# Patient Record
Sex: Female | Born: 1945 | Race: White | Hispanic: No | Smoking: Never smoker
Health system: Southern US, Community
[De-identification: ages and names within clinical notes are randomized; demographics above are authoritative.]

## PROBLEM LIST (undated history)

## (undated) DIAGNOSIS — R002 Palpitations: Secondary | ICD-10-CM

## (undated) HISTORY — DX: Palpitations: R00.2

## (undated) HISTORY — PX: WISDOM TOOTH EXTRACTION: SHX21

---

## 1998-05-21 ENCOUNTER — Other Ambulatory Visit: Admission: RE | Admit: 1998-05-21 | Discharge: 1998-05-21 | Payer: Self-pay | Admitting: *Deleted

## 1999-05-27 ENCOUNTER — Other Ambulatory Visit: Admission: RE | Admit: 1999-05-27 | Discharge: 1999-05-27 | Payer: Self-pay | Admitting: *Deleted

## 1999-05-28 ENCOUNTER — Other Ambulatory Visit: Admission: RE | Admit: 1999-05-28 | Discharge: 1999-05-28 | Payer: Self-pay | Admitting: *Deleted

## 1999-05-28 ENCOUNTER — Encounter (INDEPENDENT_AMBULATORY_CARE_PROVIDER_SITE_OTHER): Payer: Self-pay

## 2000-05-30 ENCOUNTER — Other Ambulatory Visit: Admission: RE | Admit: 2000-05-30 | Discharge: 2000-05-30 | Payer: Self-pay | Admitting: *Deleted

## 2000-06-02 ENCOUNTER — Encounter: Payer: Self-pay | Admitting: *Deleted

## 2000-06-02 ENCOUNTER — Encounter: Admission: RE | Admit: 2000-06-02 | Discharge: 2000-06-02 | Payer: Self-pay | Admitting: *Deleted

## 2000-06-30 ENCOUNTER — Encounter (INDEPENDENT_AMBULATORY_CARE_PROVIDER_SITE_OTHER): Payer: Self-pay

## 2000-06-30 ENCOUNTER — Other Ambulatory Visit: Admission: RE | Admit: 2000-06-30 | Discharge: 2000-06-30 | Payer: Self-pay | Admitting: *Deleted

## 2000-07-14 ENCOUNTER — Encounter (INDEPENDENT_AMBULATORY_CARE_PROVIDER_SITE_OTHER): Payer: Self-pay

## 2000-07-14 ENCOUNTER — Other Ambulatory Visit: Admission: RE | Admit: 2000-07-14 | Discharge: 2000-07-14 | Payer: Self-pay | Admitting: *Deleted

## 2001-06-01 ENCOUNTER — Other Ambulatory Visit: Admission: RE | Admit: 2001-06-01 | Discharge: 2001-06-01 | Payer: Self-pay | Admitting: *Deleted

## 2001-06-05 ENCOUNTER — Encounter: Admission: RE | Admit: 2001-06-05 | Discharge: 2001-06-05 | Payer: Self-pay | Admitting: *Deleted

## 2001-06-05 ENCOUNTER — Encounter: Payer: Self-pay | Admitting: *Deleted

## 2002-06-04 ENCOUNTER — Other Ambulatory Visit: Admission: RE | Admit: 2002-06-04 | Discharge: 2002-06-04 | Payer: Self-pay | Admitting: Obstetrics & Gynecology

## 2002-12-27 ENCOUNTER — Ambulatory Visit (HOSPITAL_COMMUNITY): Admission: RE | Admit: 2002-12-27 | Discharge: 2002-12-27 | Payer: Self-pay | Admitting: Internal Medicine

## 2002-12-27 ENCOUNTER — Encounter: Payer: Self-pay | Admitting: Internal Medicine

## 2003-03-09 HISTORY — PX: IRRIGATION AND DEBRIDEMENT SEBACEOUS CYST: SHX5255

## 2003-06-17 ENCOUNTER — Other Ambulatory Visit: Admission: RE | Admit: 2003-06-17 | Discharge: 2003-06-17 | Payer: Self-pay | Admitting: Obstetrics & Gynecology

## 2003-12-18 ENCOUNTER — Ambulatory Visit (HOSPITAL_BASED_OUTPATIENT_CLINIC_OR_DEPARTMENT_OTHER): Admission: RE | Admit: 2003-12-18 | Discharge: 2003-12-18 | Payer: Self-pay | Admitting: Plastic Surgery

## 2003-12-18 ENCOUNTER — Ambulatory Visit (HOSPITAL_COMMUNITY): Admission: RE | Admit: 2003-12-18 | Discharge: 2003-12-18 | Payer: Self-pay | Admitting: Plastic Surgery

## 2003-12-18 ENCOUNTER — Encounter (INDEPENDENT_AMBULATORY_CARE_PROVIDER_SITE_OTHER): Payer: Self-pay | Admitting: Specialist

## 2004-03-08 HISTORY — PX: ORIF DISTAL RADIUS FRACTURE: SUR927

## 2004-03-08 HISTORY — PX: COLONOSCOPY: SHX174

## 2004-08-10 ENCOUNTER — Ambulatory Visit (HOSPITAL_COMMUNITY): Admission: RE | Admit: 2004-08-10 | Discharge: 2004-08-10 | Payer: Self-pay | Admitting: Internal Medicine

## 2004-08-10 ENCOUNTER — Ambulatory Visit: Payer: Self-pay | Admitting: Internal Medicine

## 2004-09-24 ENCOUNTER — Ambulatory Visit (HOSPITAL_COMMUNITY): Admission: RE | Admit: 2004-09-24 | Discharge: 2004-09-24 | Payer: Self-pay | Admitting: Internal Medicine

## 2004-09-29 ENCOUNTER — Encounter (HOSPITAL_COMMUNITY): Admission: RE | Admit: 2004-09-29 | Discharge: 2004-10-29 | Payer: Self-pay | Admitting: Internal Medicine

## 2004-10-28 ENCOUNTER — Ambulatory Visit (HOSPITAL_COMMUNITY): Admission: RE | Admit: 2004-10-28 | Discharge: 2004-10-28 | Payer: Self-pay | Admitting: *Deleted

## 2004-10-28 ENCOUNTER — Ambulatory Visit (HOSPITAL_BASED_OUTPATIENT_CLINIC_OR_DEPARTMENT_OTHER): Admission: RE | Admit: 2004-10-28 | Discharge: 2004-10-29 | Payer: Self-pay | Admitting: *Deleted

## 2005-02-11 ENCOUNTER — Ambulatory Visit (HOSPITAL_COMMUNITY): Admission: RE | Admit: 2005-02-11 | Discharge: 2005-02-11 | Payer: Self-pay | Admitting: Internal Medicine

## 2005-12-10 ENCOUNTER — Ambulatory Visit (HOSPITAL_COMMUNITY): Admission: RE | Admit: 2005-12-10 | Discharge: 2005-12-10 | Payer: Self-pay | Admitting: Internal Medicine

## 2007-07-26 ENCOUNTER — Ambulatory Visit (HOSPITAL_COMMUNITY): Admission: RE | Admit: 2007-07-26 | Discharge: 2007-07-26 | Payer: Self-pay | Admitting: Internal Medicine

## 2007-08-18 ENCOUNTER — Encounter: Admission: RE | Admit: 2007-08-18 | Discharge: 2007-08-18 | Payer: Self-pay | Admitting: Obstetrics & Gynecology

## 2008-02-07 ENCOUNTER — Ambulatory Visit (HOSPITAL_COMMUNITY): Admission: RE | Admit: 2008-02-07 | Discharge: 2008-02-07 | Payer: Self-pay | Admitting: Internal Medicine

## 2009-06-13 ENCOUNTER — Encounter: Admission: RE | Admit: 2009-06-13 | Discharge: 2009-06-13 | Payer: Self-pay | Admitting: Allergy and Immunology

## 2010-02-17 ENCOUNTER — Encounter (INDEPENDENT_AMBULATORY_CARE_PROVIDER_SITE_OTHER): Payer: Self-pay | Admitting: Internal Medicine

## 2010-02-17 ENCOUNTER — Ambulatory Visit (HOSPITAL_COMMUNITY)
Admission: RE | Admit: 2010-02-17 | Discharge: 2010-02-17 | Payer: Self-pay | Source: Home / Self Care | Attending: Internal Medicine | Admitting: Internal Medicine

## 2010-03-13 ENCOUNTER — Encounter: Payer: Self-pay | Admitting: Cardiology

## 2010-03-13 ENCOUNTER — Ambulatory Visit
Admission: RE | Admit: 2010-03-13 | Discharge: 2010-03-13 | Payer: Self-pay | Source: Home / Self Care | Attending: Cardiology | Admitting: Cardiology

## 2010-03-13 DIAGNOSIS — R002 Palpitations: Secondary | ICD-10-CM | POA: Insufficient documentation

## 2010-04-08 ENCOUNTER — Other Ambulatory Visit: Payer: Self-pay | Admitting: Obstetrics and Gynecology

## 2010-04-09 NOTE — Assessment & Plan Note (Signed)
Summary: ***NP6 PALPITATIONS.   Visit Type:  Initial Consult Primary Provider:  Osborne Casco   History of Present Illness: [name] is seen at the kind request of Dr. Chauncey Cruel for evaluation of palpitations.  This nice woman has enjoyed generally excellent health.  She has had no cardiovascular issues nor has she undergone any significant testing other than a recent echocardiogram.  For some months, she has noted intermittent palpitations without associated symptoms.  These had been increasing in frequency and duration up to a recent visit to Dr. Ouida Sills, but have subsequently improved.  Nonetheless, she is symptomatic perhaps once per week.  The duration of spells is between 3 and 30 seconds.  EKG  Procedure date:  03/13/2010  Findings:      Normal sinus rhythm Left axis deviation Right ventricular conduction delay Otherwise within normal limits No previous tracing for comparison   Current Medications (verified): 1)  Zyrtec Allergy 10 Mg Tabs (Cetirizine Hcl) .... Take 1 Tab At Bedtime 2)  Astepro 0.15 % Soln (Azelastine Hcl) .... Use 1 Spray Each Nostril Daily 3)  Nasonex 50 Mcg/act Susp (Mometasone Furoate) .Marland Kitchen.. 1 Spray Each Nostril Daily 4)  Oscal 500/200 D-3 500-200 Mg-Unit Tabs (Calcium Carbonate-Vitamin D) .... Take 1 Tab Daily 5)  Fish Oil 1000 Mg Caps (Omega-3 Fatty Acids) .... 2 Caps Daily 6)  Vitamin D 1000 Unit Tabs (Cholecalciferol) .... Take 1 Tab Daily 7)  Vitamin C 1000 Mg Tabs (Ascorbic Acid) .... Take 1 Tab Daily  Past History:  PMH, FH, and Social History reviewed and updated.  Past Medical History: Palpitations  Past Surgical History: ORIF-right radius in 2006 Excision of sebaceous cyst-2005 Colonoscopy-negative in 2006 Wisdom teeth extracted  Family History: Father deceased at age 49 as a result of Alzheimer's Mother is alive and well Siblings: 2 sisters alive and well.  Social History: Employment-does not work outside of the home Married  with 2 children No use of tobacco products or alcohol  Review of Systems       patient experiences occasional headaches attributed to sinus congestion.  She requires corrective lenses.  All other systems reviewed and are negative.  Vital Signs:  Patient profile:   65 year old female Height:      69 inches Weight:      174 pounds BMI:     25.79 O2 Sat:      98 % on Room air Pulse rate:   78 / minute BP sitting:   114 / 64  (left arm)  Vitals Entered By: Dreama Saa, CNA (March 13, 2010 11:21 AM)  O2 Flow:  Room air  Physical Exam  General:  Proportionate weight and height; well-developed; no acute distress: HEENT-[place]/AT; PERRL; EOM intact; conjunctiva and lids nl:  Neck-No JVD; no carotid bruits: Endocrine-mild diffuse thyromegaly: Lungs-No tachypnea, clear without rales, rhonchi or wheezes: CV-normal PMI; normal S1 and S2: Abdomen-BS normal; soft and non-tender without masses or organomegaly: MS-No deformities, cyanosis or clubbing: Neurologic-Nl cranial nerves; symmetric strength and tone: Skin- Warm, no sig. lesions: Extremities-Nl distal pulses; no edema    Impression & Recommendations:  Problem # 1:  PALPITATIONS (ICD-785.1) Patient is experiencing symptoms suggestive of a supraventricular tachycardia.  Her echocardiogram was reviewed and demonstrates no significant abnormalities.  Her EKG shows no conditions that would predispose her to serious arrhythmias.  She was somewhat disinclined to undergo any testing.  I explained that an event recorder is a fairly minor intervention and will allow [place] to have a  specific diagnosis, which may be helpful the future if her arrhythmia worsens.  We will proceed with this study, and I will see her again in one month.  A recent TSH and basic laboratory studies were negative.  No other testing is indicated at the present time.  Other Orders: Cardionet/Event Monitor (Cardionet/Event)  Patient Instructions: 1)  Your physician  recommends that you schedule a follow-up appointment in: 5 weeks 2)  Your physician has recommended that you wear an event monitor.  Event monitors are medical devices that record the heart's electrical activity. Doctors most often use these monitors to diagnose arrhythmias. Arrhythmias are problems with the speed or rhythm of the heartbeat. The monitor is a small, portable device. You can wear one while you do your normal daily activities. This is usually used to diagnose what is causing palpitations/syncope (passing out).

## 2010-04-15 ENCOUNTER — Encounter: Payer: Self-pay | Admitting: Cardiology

## 2010-04-15 ENCOUNTER — Ambulatory Visit (INDEPENDENT_AMBULATORY_CARE_PROVIDER_SITE_OTHER): Payer: BC Managed Care – PPO | Admitting: Cardiology

## 2010-04-15 DIAGNOSIS — I471 Supraventricular tachycardia: Secondary | ICD-10-CM

## 2010-04-29 NOTE — Assessment & Plan Note (Signed)
Summary: 5 wk.f/u per checkout on 03/13/10/tg/lv   Visit Type:  5 week follow up Primary Provider:  Osborne Casco   History of Present Illness: [name] has continued to experience occasional episodes of palpitations lasting a matter of seconds.  She was not absolutely certain that these 4 identical to her previous symptoms, but event recording shows that she reliably identified runs of nonsustained supraventricular tachycardia.  Episodes are somewhat different than her initial spells in that they are of shorter duration, less intense and less frequent.  The longest episode she recalls was 30 seconds.  She does not feel that she needs pharmacologic therapy to suppress his arrhythmias.  Preventive Screening-Counseling & Management  Alcohol-Tobacco     Smoking Status: never  Current Medications (verified): 1)  Zyrtec Allergy 10 Mg Tabs (Cetirizine Hcl) .... Take 1 Tab At Bedtime 2)  Astepro 0.15 % Soln (Azelastine Hcl) .... Use 1 Spray Each Nostril Daily 3)  Nasonex 50 Mcg/act Susp (Mometasone Furoate) .Marland Kitchen.. 1 Spray Each Nostril Daily 4)  Calcium Citrate + 315-200 Mg-Unit Tabs (Calcium Citrate-Vitamin D) .... Take 2 Tablets By Mouth Two Times A Day 5)  Fish Oil 1000 Mg Caps (Omega-3 Fatty Acids) .... 2 Caps Daily 6)  Vitamin D 1000 Unit Tabs (Cholecalciferol) .... Take 1 Tab Daily 7)  Vitamin C 1000 Mg Tabs (Ascorbic Acid) .... Take 1 Tab Daily 8)  Vitamin D 2000 Unit Tabs (Cholecalciferol) .... Take 1 Tablet By Mouth Once A Day  Allergies: 1)  ! Penicillin  Past History:  PMH, FH, and Social History reviewed and updated.  Past Medical History: Palpitations-short runs of SVT by event recording associated with patient's symptoms-2012  Social History: Smoking Status:  never  Vital Signs:  Patient profile:   65 year old female Height:      69 inches Weight:      171 pounds O2 Sat:      97 % on Room air Pulse rate:   78 / minute BP sitting:   107 / 72  (left  arm)  Vitals Entered By: Teressa Lower RN (April 15, 2010 11:31 AM)  O2 Flow:  Room air  Physical Exam  General:  Proportionate weight and height; well-developed; no acute distress: Neck-No JVD; no carotid bruits: Lungs-No tachypnea, clear without rales, rhonchi or wheezes: CV-normal PMI; normal S1 and S2: Abdomen-BS normal; soft and non-tender without masses or organomegaly: MS-No deformities, cyanosis or clubbing: Neurologic-Nl cranial nerves; symmetric strength and tone: Skin- Warm, no sig. lesions: Extremities-Nl distal pulses; no edema    Impression & Recommendations:  Problem # 1:  PALPITATIONS (ICD-785.1) Event recording documents the presence of short runs of supraventricular tachycardia, none lasting more than a few seconds, correlating with the patient's symptoms.  We discussed the possible benefit of treatment with a beta blocker; however, I reassured her that neither her condition nor the absence of treatment for it would adversely effect her overall health.  She prefers not to take medication on a daily basis.  She will call if she experiences more severe or prolonged spells.  I explained that there is considerable variability to these arrhythmias, but a sustained tachycardia may occur at some point in the future.  Patient Instructions: 1)  Your physician recommends that you schedule a follow-up appointment in: AS NEEDED 2)  Your physician recommends that you continue on your current medications as directed. Please refer to the Current Medication list given to you today.

## 2010-07-24 NOTE — Op Note (Signed)
Veronica Snyder, Veronica Snyder             ACCOUNT NO.:  [id number]   MEDICAL RECORD NO.:  [id number]          PATIENT TYPE:  AMB   LOCATION:  DAY                           FACILITY:  [facility]   PHYSICIAN:  Lionel December, M.D.    DATE OF BIRTH:  Nov 09, 1945   DATE OF PROCEDURE:  08/10/2004  DATE OF DISCHARGE:                                 OPERATIVE REPORT   PROCEDURE:  Colonoscopy.   INDICATION:  Caree is a 65 year old Caucasian female who is here for  screening colonoscopy.  Family history is negative for colorectal carcinoma.  The procedure risks were reviewed with the patient and informed consent was  obtained.   PREMEDICATION:  Demerol 25 mg IV, Versed 6 mg IV in divided dose.   FINDINGS:  Procedure performed in endoscopy suite.  The patient's vital  signs and O2 saturation were monitored during procedure and remained stable.  The patient was placed in the left lateral recumbent position and rectal  examination performed.  No abnormality noted on external or digital exam.  The Olympus video scope was placed in the rectum and advanced under vision  into sigmoid colon, which was tortuous.  The scope was slowly and carefully advanced into descending colon and across  the splenic flexure into transverse colon.  Further intubation of the cecum  was easy.  Preparation was felt to be satisfactory to excellent.  The scope  was advanced to cecum, which was identified by appendiceal orifice and  ileocecal valve.  Pictures taken for the record.  As the scope was  withdrawn, the colonic mucosa was carefully examined and was normal  throughout.  Rectal mucosa similarly was normal.  The scope was retroflexed  to examine the anorectal junction, and small hemorrhoids were noted below  the dentate line.  The endoscope was straightened and withdrawn.  The patient tolerated the  procedure well.   FINAL DIAGNOSIS:  Small external hemorrhoids, otherwise normal colonoscopy.   RECOMMENDATIONS:  She  should continue yearly Hemoccults and consider next  screening in 10 years from now.       NR/MEDQ  D:  08/10/2004  T:  08/10/2004  Job:  [id number]   cc:   Kingsley Callander. Ouida Sills, MD  [address]  [place]  [place] [address]  Fax: [phone]

## 2010-07-24 NOTE — Op Note (Signed)
NAMEMORNA, FLUD             ACCOUNT NO.:  [id number]   MEDICAL RECORD NO.:  [id number]          PATIENT TYPE:  AMB   LOCATION:  DSC                          FACILITY:  [facility]   PHYSICIAN:  Alfredia Ferguson, M.D.  DATE OF BIRTH:  1946/01/22   DATE OF PROCEDURE:  12/18/2003  DATE OF DISCHARGE:                                 OPERATIVE REPORT   REFERRING PHYSICIAN:  Kingsley Callander. Ouida Sills, M.D.   PREOPERATIVE DIAGNOSIS:  Sebaceous cyst right lateral neck 9 mm.   POSTOPERATIVE DIAGNOSIS:  Sebaceous cyst right lateral neck 9 mm.   OPERATION:  Excision of sebaceous cyst right lateral neck.   SURGEON:  Alfredia Ferguson, M.D.   ANESTHESIA:  Xylocaine 2% with 1:100,000 epinephrine.   INDICATIONS FOR PROCEDURE:  This is a 65 year old woman who has a cyst which  is in a relatively quiescent stage at present.  It has been infected on a  couple of occasions which required the patient to be on antibiotics.  The  patient wishes to have the residual cyst removed while it is not infected.  She understands she was trading what she has for permanent and potentially  unsightly scar.  In spite of that, she wishes to proceed with surgery.   DESCRIPTION OF PROCEDURE:  Skin marks were placed around the diseased skin  punctum.  Local anesthesia using 2% Xylocaine with 1:100,000 epinephrine was  infiltrated.  The right neck was prepped with Betadine and draped with  sterile drapes.  Elliptical skin incision was made until reaching the  subcutaneous tissue.  Using scissor dissection, the cyst capsule was  visualized and was dissected away from its surrounding adherent tissue.  The  cyst was removed in toto.  The cyst was passed off for pathology.  Hemostasis was accomplished using electrocautery.  Subcutaneous tissue was  approximated with an interrupted 5-0 Monocryl suture.  The dermis was closed  with multiple interrupted 5-0 Monocryl sutures.  Skin edges were closed with  Steri-Strips.  The area was  cleansed, dried and Steri-Strips were applied.  The patient was discharged to home in satisfactory condition.      WBB/MEDQ  D:  12/18/2003  T:  12/18/2003  Job:  [id number]   cc:   Kingsley Callander. Ouida Sills, M.D.  [address]  [place]  [place] [address]  Fax: [phone]

## 2010-07-24 NOTE — Op Note (Signed)
NAMEANGELLICA, Veronica Snyder             ACCOUNT NO.:  [id number]   MEDICAL RECORD NO.:  [id number]          PATIENT TYPE:  AMB   LOCATION:  DSC                          FACILITY:  [facility]   PHYSICIAN:  Lowell Bouton, M.D.DATE OF BIRTH:  [birth year]   DATE OF PROCEDURE:  10/28/2004  DATE OF DISCHARGE:                                 OPERATIVE REPORT   PREOPERATIVE DIAGNOSIS:  Fracture, right distal radius.   POSTOPERATIVE DIAGNOSIS:  Fracture, right distal radius.   PROCEDURE:  Open reduction internal fixation, right distal radius fracture.   SURGEON:  Lowell Bouton, M.D.   ANESTHESIA:  General.   OPERATIVE FINDINGS:  The patient had a comminuted fracture that was extra-  articular with shortening and neutral tilt.   PROCEDURE:  Under general anesthesia with a tourniquet on the right arm, the  right hand was prepped and draped in usual fashion, and after explaining the  limb, the tourniquet was inflated to 250 mmHg. A longitudinal incision was  made over the radial volar aspect of the wrist. Sharp dissection was carried  through the subcutaneous tissues, and bleeding points were coagulated. Blunt  dissection was carried down to the FCR tendon, and the sheath was incised  longitudinally. The tendon was then retracted ulnarly, and the floor of the  sheath was incised with the scissors. Blunt dissection was carried down to  the pronator quadratus, and this was sharply removed from the attachment at  the radius. A Freer elevator was used to elevate the pronator quadratus off  of the bone, and the fracture site was identified. It was irrigated and  reduced. Ten pounds of traction were applied across the wrist using finger  traps and the radial three digits. Five pounds of the traction were removed.  This allowed for a good reduction of the fracture. A standard DDR plate was  then applied to the volar aspect of the radius, and x-rays showed good  position. A 3.5-mm screw  was placed in the slotted hole and then adjusted  with the plate pushed distally. The nonthreaded pegs were then inserted in  the proximal four holes distally, and partially threaded pegs were inserted  into the three holes distally. The remaining three screws were placed using  3.5-mm screws in the main portion of the plate, and under x-ray control,  there was no evidence of screws penetrating the joint and good position of  the fracture. By fluoroscopy, the fracture was stable. The wound was then  irrigated copiously with saline, and pronator quadratus was reattached using  4-0 Vicryl. Subcutaneous tissue was closed over a TLS drain using 4-0  Vicryl, and the skin was closed with a 3-0 subcuticular Prolene. Steri-  Strips were applied. Half percent Marcaine was inserted in the wound for  pain control. Sterile dressings were then applied followed by a volar wrist  splint. The patient tolerated the procedure well and went to recovery room  awake in stable and good condition.     Lowell Bouton, M.D.  Electronically Signed    EMM/MEDQ  D:  10/28/2004  T:  10/28/2004  Job:  [id number] 

## 2011-03-03 ENCOUNTER — Encounter: Payer: Self-pay | Admitting: Cardiology

## 2011-03-17 ENCOUNTER — Other Ambulatory Visit (INDEPENDENT_AMBULATORY_CARE_PROVIDER_SITE_OTHER): Payer: Self-pay | Admitting: Otolaryngology

## 2011-03-17 DIAGNOSIS — J320 Chronic maxillary sinusitis: Secondary | ICD-10-CM

## 2011-03-23 ENCOUNTER — Ambulatory Visit
Admission: RE | Admit: 2011-03-23 | Discharge: 2011-03-23 | Disposition: A | Discharge status: Home / Self Care | Payer: BC Managed Care – PPO | Source: Ambulatory Visit | Attending: Otolaryngology | Admitting: Otolaryngology

## 2011-03-23 DIAGNOSIS — J320 Chronic maxillary sinusitis: Secondary | ICD-10-CM

## 2012-03-06 ENCOUNTER — Other Ambulatory Visit (HOSPITAL_COMMUNITY): Payer: Self-pay | Admitting: Internal Medicine

## 2012-03-06 ENCOUNTER — Ambulatory Visit (HOSPITAL_COMMUNITY)
Admission: RE | Admit: 2012-03-06 | Discharge: 2012-03-06 | Disposition: A | Discharge status: Home / Self Care | Payer: BC Managed Care – PPO | Source: Ambulatory Visit | Attending: Internal Medicine | Admitting: Internal Medicine

## 2012-03-06 DIAGNOSIS — M25551 Pain in right hip: Secondary | ICD-10-CM

## 2012-03-06 DIAGNOSIS — M169 Osteoarthritis of hip, unspecified: Secondary | ICD-10-CM | POA: Insufficient documentation

## 2012-03-06 DIAGNOSIS — M25512 Pain in left shoulder: Secondary | ICD-10-CM

## 2012-03-06 DIAGNOSIS — M25519 Pain in unspecified shoulder: Secondary | ICD-10-CM | POA: Insufficient documentation

## 2012-03-06 DIAGNOSIS — M1610 Unilateral primary osteoarthritis, unspecified hip: Secondary | ICD-10-CM | POA: Insufficient documentation

## 2012-04-18 ENCOUNTER — Other Ambulatory Visit: Payer: Self-pay | Admitting: Dermatology

## 2012-05-03 ENCOUNTER — Ambulatory Visit (HOSPITAL_COMMUNITY)
Admission: RE | Admit: 2012-05-03 | Discharge: 2012-05-03 | Disposition: A | Discharge status: Home / Self Care | Payer: BC Managed Care – PPO | Source: Ambulatory Visit | Attending: Orthopedic Surgery | Admitting: Orthopedic Surgery

## 2012-05-03 DIAGNOSIS — M6281 Muscle weakness (generalized): Secondary | ICD-10-CM | POA: Insufficient documentation

## 2012-05-03 DIAGNOSIS — M7542 Impingement syndrome of left shoulder: Secondary | ICD-10-CM | POA: Insufficient documentation

## 2012-05-03 DIAGNOSIS — M25519 Pain in unspecified shoulder: Secondary | ICD-10-CM | POA: Insufficient documentation

## 2012-05-03 DIAGNOSIS — IMO0001 Reserved for inherently not codable concepts without codable children: Secondary | ICD-10-CM | POA: Insufficient documentation

## 2012-05-03 DIAGNOSIS — M25619 Stiffness of unspecified shoulder, not elsewhere classified: Secondary | ICD-10-CM | POA: Insufficient documentation

## 2012-05-03 NOTE — Evaluation (Signed)
Occupational Therapy Evaluation  Patient Details  Name: Veronica Snyder MRN: [id number] Date of Birth: August 08, 1945  Today's Date: 05/03/2012 Time: 0930-1030 OT Time Calculation (min): 60 min OT Evaluation 930-950 20'  Manual Therapy [phone] 15' Heat 10' Visit#: 1 of 12  Re-eval: 05/31/12   Diagnosis: Left Shoulder Impingement and Early Adhesive Capsulitis  Next MD Visit: one month Prior Therapy: n/a   Past Medical History:  Past Medical History  Diagnosis Date  . Palpitations     Short runs of SVT by event recording associated with patient's symptoms 2012   Past Surgical History:  Past Surgical History  Procedure Laterality Date  . Orif distal radius fracture  2006    Right  . Irrigation and debridement sebaceous cyst  2005  . Colonoscopy  2006    Negative  . Wisdom tooth extraction      Subjective  S:  I have been having pain for about a year.  I think it is a combination of lifting my grandson and arthritis. Pertinent History: Veronica Snyder has been experiencing increased pain and tightness in her left shoulder for approximately a year.  Her pain level increased and mobility decreased to a point that she consulted wtih her PCP and was referred to Dr. Rennis Chris.  Dr. Rennis Chris has referred her to occupational therapy for evaluation and treatment of her left shoulder region.   Special Tests: DASH scored 27.3 with 0 being the ideal score Patient Stated Goals: I want to be able to lift my grandson and fasten my bra behind my back without experiencing pain.  Pain Assessment Currently in Pain?: Yes Pain Score:   7 Pain Location: Shoulder Pain Orientation: Left Pain Type: Acute pain;Other (Comment) Pain Radiating Towards: pain is 0/10 at rest, Veronica Snyder experiences 7/10 sharp intermittent pain with certain activities of daily living.  Precautions/Restrictions  Precautions Precautions: None  Prior Functioning  Home Living Lives With: Spouse Prior Function Driving:  Yes Vocation: Retired Leisure: Hobbies-yes (Comment) Comments: caring for her grandchildren  Assessment ADL/Vision/Perception ADL ADL Comments: Veronica Snyder has increased pain and difficulty when reaching behind her back and is not longer able to fasten her bra behind her back.  She has pain when reaching overhead and when attempting to lift her 50 year old grandson into his carseat.  Reaching out to her left for the bank windown is painful.  Dominant Hand: Right  Cognition/Observation Cognition Overall Cognitive Status: Appears within functional limits for tasks assessed  Sensation/Coordination/Edema Sensation Light Touch: Appears Intact Coordination Gross Motor Movements are Fluid and Coordinated: Yes Fine Motor Movements are Fluid and Coordinated: Yes  Additional Assessments LUE AROM (degrees) LUE Overall AROM Comments: assessed in standing.  ER/IR assessed in 0 degrees of abd and 90 degrees of Abd Left Shoulder Flexion: 125 Degrees Left Shoulder ABduction: 120 Degrees Left Shoulder Internal Rotation: 78 Degrees (with shoulder abducted to 90 IR is 10) Left Shoulder External Rotation: 65 Degrees (with shoulder abducted to 90 ER is 80) LUE PROM (degrees) LUE Overall PROM Comments: assessed in supine 80% flexion, abduction, 75% ER, and 25% IR LUE Strength LUE Overall Strength Comments: assessed in standing, ER/IR tested with shoulder abducted to 90 Left Shoulder Flexion: 4/5 Left Shoulder ABduction: 4/5 Left Shoulder Internal Rotation: 4/5 Left Shoulder External Rotation: 4/5 Palpation Palpation: mod fascial restrictions in upper arm, trapezius region.  Max restrictions in scapular region     Exercise/Treatments    Modalities Modalities: Moist Heat Manual Therapy Manual Therapy: Myofascial release Myofascial Release:  MFR and manual stretching to left upper arm, shoulder, scapular, trapezius region to decrease pain and fascial restrictions and increase pain free mobility.    Moist Heat Therapy Number Minutes Moist Heat: 10 Minutes Moist Heat Location: Shoulder  Occupational Therapy Assessment and Plan OT Assessment and Plan Clinical Impression Statement: A:  Patient is a 67 year old female with increased pain and decreased mobility in her left shoulder causing decreased I with desired daily activities.  Skilled OT intervention is indicated to increase pain free mobility and strength and decrease pain and fascial restricitons in her left shoulder.  Pt will benefit from skilled therapeutic intervention in order to improve on the following deficits: Decreased range of motion;Decreased strength;Pain;Increased fascial restricitons;Increased muscle spasms Rehab Potential: Good OT Frequency: Min 2X/week OT Duration: 6 weeks OT Treatment/Interventions: Self-care/ADL training;Therapeutic exercise;Manual therapy;Therapeutic activities;Modalities OT Plan: P:  Skilled OT intervention is indicated to increase pain free mobility and strength and decrease pain and fascial restricitons in her left shoulder in order to complete all desired acitivities with her left arm.  Treatment Plan:  MFR and manual stretching to left shoulder and associated areas.  AAROM progressing to AROM in supine and seated.  Scapular stability and strengtheing activities.  shoulder stretches to decrease rotator cuff tightness.     Goals Short Term Goals Time to Complete Short Term Goals: 3 weeks Short Term Goal 1: Patient will be educated on a HEP. Short Term Goal 2: Patient will increase her left shoulder PROM to WNL for increased ability to don shirts and coats with less pain. Short Term Goal 3: Patient will increase left shoulder strength to 4+/5 for increased ability to lift her grandson into his carseat. Short Term Goal 4: Patient will increase her left shoulder pain to 4/10 when fastening her bra. Short Term Goal 5: Patient will decrease fascial restrictions to min-mod in her left shoulder region  for increased mobiility needed to reach behind her back. Long Term Goals Time to Complete Long Term Goals: 6 weeks Long Term Goal 1: Patient will return to prior level of independence with all functional, leisure, and desired acitivties.   Long Term Goal 2: Patient will increase her left shoulder AROM to WNL for increased ability to reach into the bank teller  window and reach behind her back. Long Term Goal 3: Patient will increase left shoulder strength to 5/5 for increased ability to lift her grandson into the car. Long Term Goal 4: Patient will decrease her pain level to 1/10 when reaching out for the bank teller windown with her left arm. Long Term Goal 5: Patient will decrease fascial restrictions to minimal for increased ability to reach behind her back.   Problem List Patient Active Problem List  Diagnosis  . PALPITATIONS  . Pain in joint, shoulder region  . Impingement syndrome of left shoulder  . Adhesive capsulitis of shoulder    End of Session Activity Tolerance: Patient tolerated treatment well General Behavior During Session: [facility] for tasks performed OT Plan of Care OT Home Exercise Plan: Educated patient on HEP for shoulder stretches, door stretch for subscapularis, and tband for scapular stability.  Consulted and Agree with Plan of Care: Patient  GO    Julius Boniface, OTR/L  05/03/2012, 11:04 AM  Physician Documentation Your signature is required to indicate approval of the treatment plan as stated above.  Please sign and either send electronically or make a copy of this report for your files and return this physician signed original.  Please mark one 1.__approve of plan  2. ___approve of plan with the following conditions.   ______________________________                                                          _____________________ Physician Signature                                                                                                              Date

## 2012-05-05 ENCOUNTER — Ambulatory Visit (HOSPITAL_COMMUNITY)
Admission: RE | Admit: 2012-05-05 | Discharge: 2012-05-05 | Disposition: A | Discharge status: Home / Self Care | Payer: BC Managed Care – PPO | Source: Ambulatory Visit | Attending: Orthopedic Surgery | Admitting: Orthopedic Surgery

## 2012-05-05 NOTE — Progress Notes (Signed)
Occupational Therapy Treatment Patient Details  Name: Veronica Snyder MRN: [id number] Date of Birth: 06-08-1945  Today's Date: 05/05/2012 Time: 4098-1191 OT Time Calculation (min): 46 min MFR 932-945 13' Therex [phone] 33'  Visit#: 2 of 12  Re-eval: 05/31/12    Authorization: n/a  Authorization Time Period:    Authorization Visit#:   of    Subjective Symptoms/Limitations Symptoms: S: I'm a little sore from Wednesday. The pain comes and goes. Pain Assessment Currently in Pain?: Yes Pain Score:   6 Pain Location: Shoulder Pain Orientation: Left Pain Type: Acute pain  Precautions/Restrictions   None  Exercise/Treatments Supine Protraction: PROM;AAROM;10 reps Horizontal ABduction: PROM;AAROM;10 reps External Rotation: PROM;AAROM;10 reps Internal Rotation: PROM;AAROM;10 reps Flexion: PROM;AAROM;10 reps ABduction: PROM;AAROM;10 reps Seated Elevation: AROM;10 reps Extension: AROM;10 reps Therapy Ball Flexion: 10 reps ABduction: 10 reps ROM / Strengthening / Isometric Strengthening Thumb Tacks: 1'      Manual Therapy Manual Therapy: Myofascial release Myofascial Release: MFR and manual stretching to left upper arm, shoulder, scapular, trapezius region to decrease pain and fascial restrictions and increase pain free mobility.   Occupational Therapy Assessment and Plan OT Assessment and Plan Clinical Impression Statement: A: Tolerated exercises well. Slight pain during end stretch of shoulder flexion and abduction. Reviewed HEP exercises with patient as she had questions on technique. Pt will benefit from skilled therapeutic intervention in order to improve on the following deficits: Decreased range of motion;Decreased strength;Pain;Increased fascial restricitons;Increased muscle spasms Rehab Potential: Good OT Frequency: Min 2X/week OT Duration: 6 weeks OT Treatment/Interventions: Self-care/ADL training;Therapeutic exercise;Manual therapy;Therapeutic  activities;Modalities OT Plan: P: Add pro/ret/elev/dep and pulleys.  Goals Short Term Goals Time to Complete Short Term Goals: 3 weeks Short Term Goal 1: Patient will be educated on a HEP. Short Term Goal 1 Progress: Progressing toward goal Short Term Goal 2: Patient will increase her left shoulder PROM to WNL for increased ability to don shirts and coats with less pain. Short Term Goal 2 Progress: Progressing toward goal Short Term Goal 3: Patient will increase left shoulder strength to 4+/5 for increased ability to lift her grandson into his carseat. Short Term Goal 3 Progress: Progressing toward goal Short Term Goal 4: Patient will increase her left shoulder pain to 4/10 when fastening her bra. Short Term Goal 4 Progress: Progressing toward goal Short Term Goal 5: Patient will decrease fascial restrictions to min-mod in her left shoulder region for increased mobiility needed to reach behind her back. Short Term Goal 5 Progress: Progressing toward goal Long Term Goals Time to Complete Long Term Goals: 6 weeks Long Term Goal 1: Patient will return to prior level of independence with all functional, leisure, and desired acitivties.   Long Term Goal 1 Progress: Progressing toward goal Long Term Goal 2: Patient will increase her left shoulder AROM to WNL for increased ability to reach into the bank teller  window and reach behind her back. Long Term Goal 2 Progress: Progressing toward goal Long Term Goal 3: Patient will increase left shoulder strength to 5/5 for increased ability to lift her grandson into the car. Long Term Goal 3 Progress: Progressing toward goal Long Term Goal 4: Patient will decrease her pain level to 1/10 when reaching out for the bank teller [place] with her left arm. Long Term Goal 4 Progress: Progressing toward goal Long Term Goal 5: Patient will decrease fascial restrictions to minimal for increased ability to reach behind her back.  Long Term Goal 5 Progress:  Progressing toward goal  Problem List Patient  Active Problem List  Diagnosis  . PALPITATIONS  . Pain in joint, shoulder region  . Impingement syndrome of left shoulder  . Adhesive capsulitis of shoulder    End of Session Activity Tolerance: Patient tolerated treatment well General Behavior During Session: [facility] for tasks performed   Limmie Patricia, OTR/L  05/05/2012, 10:22 AM

## 2012-05-08 ENCOUNTER — Ambulatory Visit (HOSPITAL_COMMUNITY)
Admission: RE | Admit: 2012-05-08 | Discharge: 2012-05-08 | Disposition: A | Discharge status: Home / Self Care | Payer: BC Managed Care – PPO | Source: Ambulatory Visit | Attending: Orthopedic Surgery | Admitting: Orthopedic Surgery

## 2012-05-08 DIAGNOSIS — M6281 Muscle weakness (generalized): Secondary | ICD-10-CM | POA: Insufficient documentation

## 2012-05-08 DIAGNOSIS — M7502 Adhesive capsulitis of left shoulder: Secondary | ICD-10-CM

## 2012-05-08 DIAGNOSIS — IMO0001 Reserved for inherently not codable concepts without codable children: Secondary | ICD-10-CM | POA: Insufficient documentation

## 2012-05-08 DIAGNOSIS — M25619 Stiffness of unspecified shoulder, not elsewhere classified: Secondary | ICD-10-CM | POA: Insufficient documentation

## 2012-05-08 DIAGNOSIS — M25519 Pain in unspecified shoulder: Secondary | ICD-10-CM | POA: Insufficient documentation

## 2012-05-08 DIAGNOSIS — M7542 Impingement syndrome of left shoulder: Secondary | ICD-10-CM

## 2012-05-08 NOTE — Progress Notes (Signed)
Occupational Therapy Treatment Patient Details  Name: ROSEMARIE GALVIS MRN: [id number] Date of Birth: [birth year]  Today's Date: 05/08/2012 Time: 1191-4782 OT Time Calculation (min): 45 min Manual Therapy 850-910 20' Therapeutic Exercises 910-935 25'  Visit#: 3 of 12  Re-eval: 05/31/12    Authorization: n/a   Subjective  S:  I feel the most pain back behind my shoulder blade (infraspinatus/subscapularis) Pain Assessment Currently in Pain?: No/denies Pain Score: 0-No pain  Precautions/Restrictions   progress as tolerated  Exercise/Treatments Supine Protraction: PROM;10 reps;AAROM;12 reps Horizontal ABduction: PROM;10 reps;AAROM;12 reps External Rotation: PROM;10 reps;AAROM;12 reps Internal Rotation: PROM;10 reps;AAROM;12 reps Flexion: PROM;10 reps;AAROM;12 reps ABduction: PROM;10 reps;AAROM;12 reps Standing External Rotation: Theraband;10 reps Theraband Level (Shoulder External Rotation): Level 2 (Red) Internal Rotation: Theraband;10 reps Theraband Level (Shoulder Internal Rotation): Level 2 (Red) Extension: Theraband;10 reps Theraband Level (Shoulder Extension): Level 2 (Red) Row: Theraband;10 reps Theraband Level (Shoulder Row): Level 2 (Red) Therapy Ball Flexion: 15 reps ABduction: 15 reps Right/Left: 5 reps    Manual Therapy Manual Therapy: Myofascial release Myofascial Release: MFR and manual stretching to left upper arm, shoulder, scapular, trapezius region, subscapularis, infraspinatus region to decrease pain and fascial restrictions and increase pain free mobility.  [phone]  Occupational Therapy Assessment and Plan OT Assessment and Plan Clinical Impression Statement: A:  Continued tenderness to palpation along subscapularis and infraspinatus region during MFR.  Improvement in PROM of abduction and external rotation this date.   OT Plan: P:  Add prot/ret/elev/dep and proximal shoulder strengthening in supine for increased proximal shoulder stability for  better alignment.  Follow up on HEP.   Goals Short Term Goals Time to Complete Short Term Goals: 3 weeks Short Term Goal 1: Patient will be educated on a HEP. Short Term Goal 1 Progress: Progressing toward goal Short Term Goal 2: Patient will increase her left shoulder PROM to WNL for increased ability to don shirts and coats with less pain. Short Term Goal 2 Progress: Progressing toward goal Short Term Goal 3: Patient will increase left shoulder strength to 4+/5 for increased ability to lift her grandson into his carseat. Short Term Goal 3 Progress: Progressing toward goal Short Term Goal 4: Patient will increase her left shoulder pain to 4/10 when fastening her bra. Short Term Goal 4 Progress: Progressing toward goal Short Term Goal 5: Patient will decrease fascial restrictions to min-mod in her left shoulder region for increased mobiility needed to reach behind her back. Short Term Goal 5 Progress: Progressing toward goal Long Term Goals Time to Complete Long Term Goals: 6 weeks Long Term Goal 1: Patient will return to prior level of independence with all functional, leisure, and desired acitivties.   Long Term Goal 1 Progress: Progressing toward goal Long Term Goal 2: Patient will increase her left shoulder AROM to WNL for increased ability to reach into the bank teller  window and reach behind her back. Long Term Goal 2 Progress: Progressing toward goal Long Term Goal 3: Patient will increase left shoulder strength to 5/5 for increased ability to lift her grandson into the car. Long Term Goal 3 Progress: Progressing toward goal Long Term Goal 4: Patient will decrease her pain level to 1/10 when reaching out for the bank teller [place] with her left arm. Long Term Goal 4 Progress: Progressing toward goal Long Term Goal 5: Patient will decrease fascial restrictions to minimal for increased ability to reach behind her back.  Long Term Goal 5 Progress: Progressing toward goal  Problem  List Patient Active Problem  List  Diagnosis  . PALPITATIONS  . Pain in joint, shoulder region  . Impingement syndrome of left shoulder  . Adhesive capsulitis of shoulder    End of Session Activity Tolerance: Patient tolerated treatment well General Behavior During Session: [facility] for tasks performed Cognition: [facility] for tasks performed OT Plan of Care OT Home Exercise Plan: Educated patient on HEP for dowel rod exercises in supine.  Reviewed door stretch for subscapularis and educated on correct body position for greater stretch.  Patient able to demonstrate proper positioning and felt a more intense stretch in correct body position.  Reviewed tband exercises and patient is completing correctly.   Consulted and Agree with Plan of Care: Patient   Justyce Yeater, OTR/L  05/08/2012, 10:40 AM

## 2012-05-12 ENCOUNTER — Ambulatory Visit (HOSPITAL_COMMUNITY): Payer: BC Managed Care – PPO | Admitting: Specialist

## 2012-05-16 ENCOUNTER — Ambulatory Visit (HOSPITAL_COMMUNITY)
Admission: RE | Admit: 2012-05-16 | Discharge: 2012-05-16 | Disposition: A | Discharge status: Home / Self Care | Payer: BC Managed Care – PPO | Source: Ambulatory Visit | Attending: Orthopedic Surgery | Admitting: Orthopedic Surgery

## 2012-05-16 NOTE — Progress Notes (Signed)
Occupational Therapy Treatment Patient Details  Name: Veronica Snyder MRN: [id number] Date of Birth: 10-14-1945  Today's Date: 05/16/2012 Time: 3244-0102 OT Time Calculation (min): 40 min MFR 850-902 12' Therex 902-930 28'  Visit#: 4 of 12  Re-eval: 05/31/12    Authorization: n/a  Authorization Time Period:    Authorization Visit#:   of    Subjective Symptoms/Limitations Symptoms: S: I'm still trying to figure out how to relax my shoulder and not be so tense. Pain Assessment Currently in Pain?: No/denies  Precautions/Restrictions  Precautions Precautions: None  Exercise/Treatments Supine Protraction: PROM;10 reps;AAROM;12 reps Horizontal ABduction: PROM;10 reps;AAROM;12 reps External Rotation: PROM;10 reps;AAROM;12 reps Internal Rotation: PROM;10 reps;AAROM;12 reps Flexion: PROM;10 reps;AAROM;12 reps ABduction: PROM;10 reps;AAROM;12 reps Seated Elevation: AROM;10 reps Extension: AROM;10 reps Standing External Rotation: Theraband;10 reps Theraband Level (Shoulder External Rotation): Level 2 (Red) Internal Rotation: Theraband;10 reps Theraband Level (Shoulder Internal Rotation): Level 2 (Red) Flexion: Theraband;10 reps Theraband Level (Shoulder Flexion): Level 2 (Red) Extension: Theraband;10 reps Theraband Level (Shoulder Extension): Level 2 (Red) Row: Theraband;10 reps Theraband Level (Shoulder Row): Level 2 (Red) Therapy Ball Flexion: 15 reps ABduction: 15 reps Right/Left: 5 reps            Manual Therapy Manual Therapy: Myofascial release Myofascial Release: MFR and manual stretching to left upper arm, shoulder, scapular, trapezius region, subscapularis, infraspinatus region to decrease pain and fascial restrictions and increase pain free mobility.  Occupational Therapy Assessment and Plan OT Assessment and Plan Clinical Impression Statement: A: Reviewed HEP. Recommended increasing repitions if exercises become too easy.  OT Plan: P: Add  pulleys.   Goals Short Term Goals Time to Complete Short Term Goals: 3 weeks Short Term Goal 1: Patient will be educated on a HEP. Short Term Goal 1 Progress: Met Short Term Goal 2: Patient will increase her left shoulder PROM to WNL for increased ability to don shirts and coats with less pain. Short Term Goal 2 Progress: Progressing toward goal Short Term Goal 3: Patient will increase left shoulder strength to 4+/5 for increased ability to lift her grandson into his carseat. Short Term Goal 3 Progress: Progressing toward goal Short Term Goal 4: Patient will increase her left shoulder pain to 4/10 when fastening her bra. Short Term Goal 4 Progress: Progressing toward goal Short Term Goal 5: Patient will decrease fascial restrictions to min-mod in her left shoulder region for increased mobiility needed to reach behind her back. Short Term Goal 5 Progress: Progressing toward goal Long Term Goals Time to Complete Long Term Goals: 6 weeks Long Term Goal 1: Patient will return to prior level of independence with all functional, leisure, and desired acitivties.   Long Term Goal 1 Progress: Progressing toward goal Long Term Goal 2: Patient will increase her left shoulder AROM to WNL for increased ability to reach into the bank teller  window and reach behind her back. Long Term Goal 2 Progress: Progressing toward goal Long Term Goal 3: Patient will increase left shoulder strength to 5/5 for increased ability to lift her grandson into the car. Long Term Goal 3 Progress: Progressing toward goal Long Term Goal 4: Patient will decrease her pain level to 1/10 when reaching out for the bank teller [place] with her left arm. Long Term Goal 4 Progress: Progressing toward goal Long Term Goal 5: Patient will decrease fascial restrictions to minimal for increased ability to reach behind her back.  Long Term Goal 5 Progress: Progressing toward goal  Problem List Patient Active Problem List  Diagnosis  .  PALPITATIONS  . Pain in joint, shoulder region  . Impingement syndrome of left shoulder  . Adhesive capsulitis of shoulder    End of Session Activity Tolerance: Patient tolerated treatment well General Behavior During Session: [facility] for tasks performed Cognition: [facility] for tasks performed OT Plan of Care OT Home Exercise Plan: Educated patient on HEP for dowel rod exercises in supine.  Reviewed door stretch for subscapularis and educated on correct body position for greater stretch.  Patient able to demonstrate proper positioning and felt a more intense stretch in correct body position.  Reviewed tband exercises and patient is completing correctly.   Consulted and Agree with Plan of Care: Patient   Limmie Patricia, OTR/L  05/16/2012, 9:34 AM

## 2012-05-19 ENCOUNTER — Ambulatory Visit (HOSPITAL_COMMUNITY)
Admission: RE | Admit: 2012-05-19 | Discharge: 2012-05-19 | Disposition: A | Discharge status: Home / Self Care | Payer: BC Managed Care – PPO | Source: Ambulatory Visit | Attending: Orthopedic Surgery | Admitting: Orthopedic Surgery

## 2012-05-19 NOTE — Progress Notes (Signed)
Occupational Therapy Treatment Patient Details  Name: Veronica Snyder MRN: [id number] Date of Birth: 1945/09/21  Today's Date: 05/19/2012 Time: 8657-8469 OT Time Calculation (min): 44 min MFR 846-910 24' Therex 910-930 20'  Visit#: 5 of 12  Re-eval: 05/31/12    Authorization: n/a  Authorization Time Period:    Authorization Visit#:   of    Subjective Symptoms/Limitations Symptoms: S: I doing my exercises in the morning and at night. Pain Assessment Currently in Pain?: No/denies  Precautions/Restrictions  Precautions Precautions: None  Exercise/Treatments Supine Protraction: PROM;10 reps;AAROM;15 reps Horizontal ABduction: PROM;10 reps;AAROM;15 reps External Rotation: PROM;10 reps;AAROM;15 reps Internal Rotation: PROM;10 reps;AAROM;15 reps Flexion: PROM;10 reps;AAROM;15 reps ABduction: PROM;10 reps;AAROM;15 reps Seated Elevation: AROM;10 reps Extension: AROM;10 reps Row: AROM;10 reps Therapy Ball Flexion: 15 reps ABduction: 15 reps Right/Left: 5 reps ROM / Strengthening / Isometric Strengthening Wall Wash: 1' Thumb Tacks: 1' Prot/Ret//Elev/Dep: 1'       Manual Therapy Manual Therapy: Myofascial release Myofascial Release: MFR and manual stretching to left upper arm, shoulder, scapular, trapezius region, subscapularis, infraspinatus region to decrease pain and fascial restrictions and increase pain free mobility.  Occupational Therapy Assessment and Plan OT Assessment and Plan Clinical Impression Statement: A: Added wall wash and pro/trac/elev/dep. tolerated well. OT Plan: P: Reassess for MD appt. Add pulleys, red ball around waist.    Goals Short Term Goals Time to Complete Short Term Goals: 3 weeks Short Term Goal 1: Patient will be educated on a HEP. Short Term Goal 2: Patient will increase her left shoulder PROM to WNL for increased ability to don shirts and coats with less pain. Short Term Goal 2 Progress: Progressing toward goal Short Term Goal  3: Patient will increase left shoulder strength to 4+/5 for increased ability to lift her grandson into his carseat. Short Term Goal 3 Progress: Progressing toward goal Short Term Goal 4: Patient will increase her left shoulder pain to 4/10 when fastening her bra. Short Term Goal 4 Progress: Progressing toward goal Short Term Goal 5: Patient will decrease fascial restrictions to min-mod in her left shoulder region for increased mobiility needed to reach behind her back. Short Term Goal 5 Progress: Progressing toward goal Long Term Goals Time to Complete Long Term Goals: 6 weeks Long Term Goal 1: Patient will return to prior level of independence with all functional, leisure, and desired acitivties.   Long Term Goal 1 Progress: Progressing toward goal Long Term Goal 2: Patient will increase her left shoulder AROM to WNL for increased ability to reach into the bank teller  window and reach behind her back. Long Term Goal 2 Progress: Progressing toward goal Long Term Goal 3: Patient will increase left shoulder strength to 5/5 for increased ability to lift her grandson into the car. Long Term Goal 3 Progress: Progressing toward goal Long Term Goal 4: Patient will decrease her pain level to 1/10 when reaching out for the bank teller [place] with her left arm. Long Term Goal 4 Progress: Progressing toward goal Long Term Goal 5: Patient will decrease fascial restrictions to minimal for increased ability to reach behind her back.  Long Term Goal 5 Progress: Progressing toward goal  Problem List Patient Active Problem List  Diagnosis  . PALPITATIONS  . Pain in joint, shoulder region  . Impingement syndrome of left shoulder  . Adhesive capsulitis of shoulder    End of Session Activity Tolerance: Patient tolerated treatment well General Behavior During Session: [facility] for tasks performed Cognition: [facility] for tasks performed  Limmie Patricia, OTR/L  05/19/2012, 9:33 AM

## 2012-05-22 ENCOUNTER — Ambulatory Visit (HOSPITAL_COMMUNITY)
Admission: RE | Admit: 2012-05-22 | Discharge: 2012-05-22 | Disposition: A | Discharge status: Home / Self Care | Payer: BC Managed Care – PPO | Source: Ambulatory Visit | Attending: Orthopedic Surgery | Admitting: Orthopedic Surgery

## 2012-05-22 NOTE — Progress Notes (Signed)
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 I wasn't able to do this when I first started therapy. Special Tests: DASH score: 20.4 with ideal score being 0. Initial DASH score: 27.3 Pain Assessment Currently in Pain?: Yes Pain Score:   2 Pain Location: Shoulder Pain Orientation: Left Pain Type: Acute pain  Precautions/Restrictions  Precautions Precautions: None  Exercise/Treatments Supine Protraction: PROM;10 reps;AAROM;15 reps Horizontal ABduction: PROM;10 reps;AAROM;15 reps External Rotation: PROM;10 reps;AAROM;15 reps Internal Rotation: PROM;10 reps;AAROM;15 reps Flexion: PROM;10 reps;AAROM;15 reps ABduction: PROM;10 reps;AAROM;15 reps Seated Protraction: AAROM;10 reps Horizontal ABduction: AAROM;10 reps External Rotation: AAROM;10 reps Internal Rotation: AAROM;10 reps Flexion: AAROM;10 reps Abduction: AAROM;10 reps Standing External Rotation:  (resume) Therapy Ball Right/Left: 5 reps ROM / Strengthening / Isometric Strengthening Wall Wash: resume Thumb Tacks: resume Prot/Ret//Elev/Dep: resume      Manual Therapy Manual Therapy: Myofascial release Myofascial Release: MFR and manual stretching to left upper arm, shoulder, scapular, trapezius region, subscapularis, infraspinatus region to decrease pain and fascial restrictions and increase pain free mobility.  Occupational Therapy Assessment and Plan OT Assessment and Plan Clinical Impression Statement: A: See MD note for progress. Added AAROM in seated. Tolerated well.with  good form. OT Plan: P: Add AROM supine, pulleys, and red ball around waist.   Goals Short Term Goals Time to Complete Short Term Goals: 3 weeks Short Term Goal 1: Patient will be educated on a HEP. Short Term Goal 2: Patient will increase her left shoulder PROM to WNL for increased ability to don shirts and coats with less pain. Short Term Goal 2 Progress: Met Short Term Goal 3: Patient will increase left shoulder strength to 4+/5 for increased ability to lift her grandson into his carseat. Short Term Goal 3 Progress: Progressing toward goal Short Term Goal 4: Patient will decrease her left shoulder pain to 4/10 when fastening her bra. Short Term Goal 4 Progress: Progressing toward goal Short Term Goal 5: Patient will decrease fascial restrictions to min-mod in her left shoulder region for increased mobiility needed to reach behind her back. Short Term Goal 5 Progress: Met Long Term Goals Time to Complete Long Term Goals: 6 weeks Long Term Goal 1: Patient will return to prior level of independence with all functional, leisure, and desired acitivties.   Long Term Goal 1 Progress: Progressing toward goal Long Term Goal 2: Patient will increase her left shoulder AROM to WNL for increased ability to reach into the bank teller  window and reach behind her back. Long Term Goal 2 Progress: Progressing toward goal Long Term Goal 3: Patient will increase left shoulder strength to 5/5 for increased ability to lift her grandson into the car. Long Term Goal 3 Progress: Progressing toward goal Long Term Goal 4: Patient will decrease her pain level to 1/10 when reaching out for the bank teller window with her left arm. Long Term Goal 4 Progress: Met Long Term Goal 5: Patient will decrease fascial restrictions to minimal for increased ability to reach behind her back.  Long Term Goal 5 Progress: Progressing toward goal  Problem List Patient  Active Problem List  Diagnosis  . PALPITATIONS  . Pain in  joint, shoulder region  . Impingement syndrome of left shoulder  . Adhesive capsulitis of shoulder    End of Session Activity Tolerance: Patient tolerated treatment well General Behavior During Session: [facility] for tasks performed Cognition: [facility] for tasks performed   Limmie Patricia, OTR/L  05/22/2012, 9:44 AM

## 2012-05-25 ENCOUNTER — Ambulatory Visit (HOSPITAL_COMMUNITY)
Admission: RE | Admit: 2012-05-25 | Discharge: 2012-05-25 | Disposition: A | Discharge status: Home / Self Care | Payer: BC Managed Care – PPO | Source: Ambulatory Visit | Attending: Orthopedic Surgery | Admitting: Orthopedic Surgery

## 2012-05-25 DIAGNOSIS — M7542 Impingement syndrome of left shoulder: Secondary | ICD-10-CM

## 2012-05-25 NOTE — Progress Notes (Signed)
Occupational Therapy Treatment Patient Details  Name: Veronica Snyder MRN: [id number] Date of Birth: [birth year]  Today's Date: 05/25/2012 Time: 4098-1191 OT Time Calculation (min): 54 min Manual Therapy [phone] 21' Therapeutic Exercise [phone] 17'  Visit#: 7 of 24  Re-eval: 06/19/12    Authorization: n/a  Authorization Time Period:    Authorization Visit#:   of    Subjective Symptoms/Limitations Symptoms: S:  It doesn't hurt unless I put it in a particular position. Pain Assessment Currently in Pain?: No/denies Pain Score: 0-No pain  Precautions/Restrictions     Exercise/Treatments Supine Protraction: PROM;AROM;10 reps Horizontal ABduction: PROM;AROM;10 reps External Rotation: PROM;AROM;10 reps Internal Rotation: PROM;AROM;10 reps Flexion: PROM;AROM;10 reps ABduction: PROM;AROM;10 reps Seated Protraction: AAROM;12 reps Horizontal ABduction: AAROM;12 reps External Rotation: AAROM;12 reps Internal Rotation: AAROM;12 reps Flexion: AAROM;12 reps Abduction: AAROM;12 reps Standing External Rotation: Theraband;15 reps Theraband Level (Shoulder External Rotation): Level 2 (Red) Internal Rotation: Theraband;15 reps Theraband Level (Shoulder Internal Rotation): Level 2 (Red) Extension: Theraband;15 reps Theraband Level (Shoulder Extension): Level 2 (Red) Row: Theraband;15 reps Theraband Level (Shoulder Row): Level 2 (Red) Pulleys Flexion: 1 minute ABduction: 1 minute Therapy Ball Flexion: 15 reps ABduction: 15 reps Right/Left: 5 reps ROM / Strengthening / Isometric Strengthening Wall Wash: 2' Thumb Tacks: 1' Prot/Ret//Elev/Dep: 1'         Manual Therapy Manual Therapy: Myofascial release Myofascial Release: MFR and manual stretching to left upper arm, shoulder, scapular, trapezius region, subscapularis, infraspinatus region to decrease pain and fascial restrictions and increase pain free mobility.  Occupational Therapy Assessment and Plan OT Assessment  and Plan Clinical Impression Statement: A:  Switched from AAROM supine to AROM secondary to patient has 90% AROM in supine also added pulleys which patient stated made her arm feel looser. OT Plan: P:  Add red ball around waist to increase IR AROM   Goals Short Term Goals Time to Complete Short Term Goals: 3 weeks Short Term Goal 1: Patient will be educated on a HEP. Short Term Goal 2: Patient will increase her left shoulder PROM to WNL for increased ability to don shirts and coats with less pain. Short Term Goal 3: Patient will increase left shoulder strength to 4+/5 for increased ability to lift her grandson into his carseat. Short Term Goal 4: Patient will decrease her left shoulder pain to 4/10 when fastening her bra. Short Term Goal 5: Patient will decrease fascial restrictions to min-mod in her left shoulder region for increased mobiility needed to reach behind her back. Long Term Goals Time to Complete Long Term Goals: 6 weeks Long Term Goal 1: Patient will return to prior level of independence with all functional, leisure, and desired acitivties.   Long Term Goal 2: Patient will increase her left shoulder AROM to WNL for increased ability to reach into the bank teller  window and reach behind her back. Long Term Goal 3: Patient will increase left shoulder strength to 5/5 for increased ability to lift her grandson into the car. Long Term Goal 4: Patient will decrease her pain level to 1/10 when reaching out for the bank teller window with her left arm. Long Term Goal 5: Patient will decrease fascial restrictions to minimal for increased ability to reach behind her back.   Problem List Patient Active Problem List  Diagnosis  . PALPITATIONS  . Pain in joint, shoulder region  . Impingement syndrome of left shoulder  . Adhesive capsulitis of shoulder    End of Session Activity Tolerance: Patient tolerated treatment well General Behavior  During Session: [name] for tasks  performed Cognition: [name] for tasks performed  GO    Veronica Snyder L. Noralee Stain, COTA/L 05/25/2012, 12:29 PM

## 2012-05-29 ENCOUNTER — Ambulatory Visit (HOSPITAL_COMMUNITY): Payer: BC Managed Care – PPO | Admitting: Occupational Therapy

## 2012-05-31 ENCOUNTER — Ambulatory Visit (HOSPITAL_COMMUNITY)
Admission: RE | Admit: 2012-05-31 | Discharge: 2012-05-31 | Disposition: A | Discharge status: Home / Self Care | Payer: BC Managed Care – PPO | Source: Ambulatory Visit | Attending: Orthopedic Surgery | Admitting: Orthopedic Surgery

## 2012-05-31 DIAGNOSIS — M7542 Impingement syndrome of left shoulder: Secondary | ICD-10-CM

## 2012-05-31 DIAGNOSIS — M7502 Adhesive capsulitis of left shoulder: Secondary | ICD-10-CM

## 2012-05-31 DIAGNOSIS — M25512 Pain in left shoulder: Secondary | ICD-10-CM

## 2012-05-31 NOTE — Progress Notes (Signed)
Occupational Therapy Treatment Patient Details  Name: Veronica Snyder MRN: [id number] Date of Birth: 12-May-1945  Today's Date: 05/31/2012 Time: 7829-5621 OT Time Calculation (min): 44 min Manual Therapy [phone] 21' Therapeutic Exercise [phone] 22'  Visit#: 8 of 24  Re-eval: 06/19/12    Authorization: n/a  Authorization Time Period:    Authorization Visit#:   of    Subjective Symptoms/Limitations Symptoms: S:  The doctor said I am doing good and wants 4 more weeks of therapy Pain Assessment Currently in Pain?: No/denies Pain Score: 0-No pain  Precautions/Restrictions     Exercise/Treatments Supine Protraction: PROM;AROM;12 reps Horizontal ABduction: PROM;AROM;12 reps External Rotation: PROM;AROM;12 reps Internal Rotation: PROM;AROM;12 reps Flexion: PROM;AROM;12 reps ABduction: PROM;AROM;12 reps Seated Protraction: AAROM;12 reps;Other (comment) (standing in front of mirror for good form) Horizontal ABduction: AAROM;12 reps;Other (comment) (standing in front of mirror for good form) External Rotation: AAROM;12 reps;Other (comment) (standing in front of morror for good form) Internal Rotation: AAROM;12 reps;Other (comment) (standing in front of mirror for good form) Flexion: AAROM;12 reps (standing in front of mirror in good form) Abduction: AAROM;12 reps;Other (comment) (standing in front of mirror with good form) Standing External Rotation: Theraband;10 reps Theraband Level (Shoulder External Rotation): Level 3 (Green) Internal Rotation: Theraband;10 reps Theraband Level (Shoulder Internal Rotation): Level 3 (Green) Extension: Theraband;10 reps Theraband Level (Shoulder Extension): Level 3 (Green) Row: Theraband;10 reps Theraband Level (Shoulder Row): Level 3 (Green) Pulleys Flexion: 2 minutes ABduction: 2 minutes Therapy Ball Flexion: 15 reps ABduction: 15 reps Right/Left: 5 reps ROM / Strengthening / Isometric Strengthening Wall Wash: time resume Thumb  Tacks:  time resume Prot/Ret//Elev/Dep: time resume      Manual Therapy Manual Therapy: Myofascial release Myofascial Release: MFR and manual stretching to left upper arm, shoulder, scapular, trapezius region, subscapularis, infraspinatus region to decrease pain and fascial restrictions and increase pain free mobility.  Occupational Therapy Assessment and Plan OT Assessment and Plan Clinical Impression Statement: A:  Reviewed shoulder stretches she is completing at home to make sure she is completing without compensatory posutres.   Completed AAROM in standing in front of mirror to educate on preventing shoulder hiking with movement. OT Plan: A:  Resume missed exercises because of time, add red ball around waist to increase IR AROM.   Goals Short Term Goals Time to Complete Short Term Goals: 3 weeks Short Term Goal 1: Patient will be educated on a HEP. Short Term Goal 2: Patient will increase her left shoulder PROM to WNL for increased ability to don shirts and coats with less pain. Short Term Goal 3: Patient will increase left shoulder strength to 4+/5 for increased ability to lift her grandson into his carseat. Short Term Goal 4: Patient will decrease her left shoulder pain to 4/10 when fastening her bra. Short Term Goal 5: Patient will decrease fascial restrictions to min-mod in her left shoulder region for increased mobiility needed to reach behind her back. Long Term Goals Time to Complete Long Term Goals: 6 weeks Long Term Goal 1: Patient will return to prior level of independence with all functional, leisure, and desired acitivties.   Long Term Goal 2: Patient will increase her left shoulder AROM to WNL for increased ability to reach into the bank teller  window and reach behind her back. Long Term Goal 3: Patient will increase left shoulder strength to 5/5 for increased ability to lift her grandson into the car. Long Term Goal 4: Patient will decrease her pain level to 1/10 when  reaching out for  the bank teller window with her left arm. Long Term Goal 5: Patient will decrease fascial restrictions to minimal for increased ability to reach behind her back.   Problem List Patient Active Problem List  Diagnosis  . PALPITATIONS  . Pain in joint, shoulder region  . Impingement syndrome of left shoulder  . Adhesive capsulitis of shoulder    End of Session Activity Tolerance: Patient tolerated treatment well General Behavior During Session: [facility] for tasks performed Cognition: [facility] for tasks performed OT Plan of Care OT Home Exercise Plan: reviewed stretches for good form  GO Graceanne Guin L. Sreya Froio, COTA/L   05/31/2012, 11:56 AM

## 2012-06-01 ENCOUNTER — Ambulatory Visit (HOSPITAL_COMMUNITY)
Admission: RE | Admit: 2012-06-01 | Discharge: 2012-06-01 | Disposition: A | Discharge status: Home / Self Care | Payer: BC Managed Care – PPO | Source: Ambulatory Visit | Attending: Orthopedic Surgery | Admitting: Orthopedic Surgery

## 2012-06-01 DIAGNOSIS — M25512 Pain in left shoulder: Secondary | ICD-10-CM

## 2012-06-01 DIAGNOSIS — M7502 Adhesive capsulitis of left shoulder: Secondary | ICD-10-CM

## 2012-06-01 DIAGNOSIS — M7542 Impingement syndrome of left shoulder: Secondary | ICD-10-CM

## 2012-06-01 NOTE — Progress Notes (Signed)
Occupational Therapy Treatment Patient Details  Name: Veronica Snyder MRN: [id number] Date of Birth: [birth year]  Today's Date: 06/01/2012 Time: 4098-1191 OT Time Calculation (min): 48 min Manual Therapy [phone] 24' Therapeutic Exercises [phone] 24' Visit#: 9 of 24  Re-eval: 06/19/12     Subjective S:  Everything is getting much better, except reaching behind my back.   Pain Assessment Currently in Pain?: No/denies Pain Score: 0-No pain  Precautions/Restrictions    Progress as tolerated Exercise/Treatments Supine Protraction: PROM;Strengthening;10 reps Protraction Weight (lbs): 1 pounds Horizontal ABduction: PROM;Strengthening;10 reps Horizontal ABduction Weight (lbs): 1 External Rotation: PROM;Strengthening;10 reps External Rotation Weight (lbs): 1 Internal Rotation: PROM;Strengthening;10 reps Internal Rotation Weight (lbs): 1 Flexion: PROM;Strengthening;10 reps Shoulder Flexion Weight (lbs): 1 ABduction: PROM;Strengthening;10 reps Shoulder ABduction Weight (lbs): 1 Seated Protraction: AROM;15 reps Horizontal ABduction: AROM;15 reps External Rotation: AROM;15 reps Internal Rotation: AROM;15 reps Flexion: AROM;15 reps Abduction: AROM;15 reps ROM / Strengthening / Isometric Strengthening UBE (Upper Arm Bike): 2' and 2' 1.0 Thumb Tacks: dc "W" Arms: 10 reps X to V Arms: 10 reps Ball on Wall: 1'  Prot/Ret//Elev/Dep: dc      Manual Therapy Manual Therapy: Myofascial release Myofascial Release: Myofascial release and manual stretching to left upper arm, shoulder, scapular, trapezius region, subscapularis, infraspinatus region to decrease pain and fascial restrictions and increase pain free mobilty.    Occupational Therapy Assessment and Plan OT Assessment and Plan Clinical Impression Statement: A:  Transitioned to strengthening in supine and AROM in seated. OT Plan: P:  Increase reps with x to v, w arms, time with ball on wall to build scapular stability.      Goals Short Term Goals Time to Complete Short Term Goals: 3 weeks Short Term Goal 1: Patient will be educated on a HEP. Short Term Goal 2: Patient will increase her left shoulder PROM to WNL for increased ability to don shirts and coats with less pain. Short Term Goal 3: Patient will increase left shoulder strength to 4+/5 for increased ability to lift her grandson into his carseat. Short Term Goal 3 Progress: Progressing toward goal Short Term Goal 4: Patient will decrease her left shoulder pain to 4/10 when fastening her bra. Short Term Goal 4 Progress: Progressing toward goal Short Term Goal 5: Patient will decrease fascial restrictions to min-mod in her left shoulder region for increased mobiility needed to reach behind her back. Short Term Goal 5 Progress: Progressing toward goal Long Term Goals Time to Complete Long Term Goals: 6 weeks Long Term Goal 1: Patient will return to prior level of independence with all functional, leisure, and desired acitivties.   Long Term Goal 1 Progress: Progressing toward goal Long Term Goal 2: Patient will increase her left shoulder AROM to WNL for increased ability to reach into the bank teller  window and reach behind her back. Long Term Goal 2 Progress: Progressing toward goal Long Term Goal 3: Patient will increase left shoulder strength to 5/5 for increased ability to lift her grandson into the car. Long Term Goal 3 Progress: Progressing toward goal Long Term Goal 4: Patient will decrease her pain level to 1/10 when reaching out for the bank teller window with her left arm. Long Term Goal 4 Progress: Progressing toward goal Long Term Goal 5: Patient will decrease fascial restrictions to minimal for increased ability to reach behind her back.   Problem List Patient Active Problem List  Diagnosis  . PALPITATIONS  . Pain in joint, shoulder region  . Impingement syndrome of left  shoulder  . Adhesive capsulitis of shoulder    End of  Session Activity Tolerance: Patient tolerated treatment well General Behavior During Session: [facility] for tasks performed Cognition: [facility] for tasks performed  GO    Shirlean Mylar, OTR/L  06/01/2012, 9:35 AM

## 2012-06-05 ENCOUNTER — Ambulatory Visit (HOSPITAL_COMMUNITY)
Admission: RE | Admit: 2012-06-05 | Discharge: 2012-06-05 | Disposition: A | Discharge status: Home / Self Care | Payer: BC Managed Care – PPO | Source: Ambulatory Visit | Attending: Orthopedic Surgery | Admitting: Orthopedic Surgery

## 2012-06-05 DIAGNOSIS — M7542 Impingement syndrome of left shoulder: Secondary | ICD-10-CM

## 2012-06-05 DIAGNOSIS — M25512 Pain in left shoulder: Secondary | ICD-10-CM

## 2012-06-05 DIAGNOSIS — M7502 Adhesive capsulitis of left shoulder: Secondary | ICD-10-CM

## 2012-06-05 NOTE — Progress Notes (Signed)
Occupational Therapy Treatment Patient Details  Name: ZIAN MOHAMED MRN: [id number] Date of Birth: March 23, 1945  Today's Date: 06/05/2012 Time: 4098-1191 OT Time Calculation (min): 44 min Manual Therapy [phone] 17' Therapeutic Exercise [phone] 26'  Visit#: 10 of 24  Re-eval: 06/19/12    Authorization: n/a  Authorization Time Period:    Authorization Visit#:   of    Subjective Symptoms/Limitations Symptoms: S:  I am a little sore, am having some trouble sleeping on my left side.  Precautions/Restrictions     Exercise/Treatments Supine Protraction: PROM;Strengthening;10 reps Protraction Weight (lbs): 1 pounds Horizontal ABduction: PROM;Strengthening;10 reps Horizontal ABduction Weight (lbs): 1 External Rotation: PROM;Strengthening;10 reps External Rotation Weight (lbs): 1 Internal Rotation: PROM;Strengthening;10 reps Internal Rotation Weight (lbs): 1 Flexion: PROM;Strengthening;10 reps Shoulder Flexion Weight (lbs): 1 ABduction: PROM;Strengthening;10 reps Shoulder ABduction Weight (lbs): 1 Seated Protraction: AROM;15 reps Horizontal ABduction: AROM;15 reps External Rotation: AROM;15 reps Internal Rotation: AROM;15 reps Flexion: AROM;15 reps Abduction: AROM;15 reps Standing External Rotation: Theraband;15 reps Theraband Level (Shoulder External Rotation): Level 3 (Green) Internal Rotation: Theraband;15 reps Theraband Level (Shoulder Internal Rotation): Level 3 (Green) Extension: Theraband;15 reps Theraband Level (Shoulder Extension): Level 3 (Green) Row: Theraband;15 reps Theraband Level (Shoulder Row): Level 3 (Green) Therapy Ball Flexion: 15 reps ABduction: 15 reps Right/Left: 5 reps ROM / Strengthening / Isometric Strengthening UBE (Upper Arm Bike): 2' and 2' 1.0 Wall Wash: 3' "W" Arms: 12 reps X to V Arms: 12 reps     Manual Therapy Manual Therapy: Myofascial release Myofascial Release: Myofascial release and manual stretching to left upper arm,  shoulder, scapular, trapezius region, subscapularis, infraspinatus region to decrease pain and fascial restrictions and increase pain free mobility.  Occupational Therapy Assessment and Plan OT Assessment and Plan Clinical Impression Statement: A:  Continues with good PROM, increased reps with all exercises today. OT Plan: P:  Attempt 1# with seated exercises.   Goals Short Term Goals Time to Complete Short Term Goals: 3 weeks Short Term Goal 1: Patient will be educated on a HEP. Short Term Goal 2: Patient will increase her left shoulder PROM to WNL for increased ability to don shirts and coats with less pain. Short Term Goal 3: Patient will increase left shoulder strength to 4+/5 for increased ability to lift her grandson into his carseat. Short Term Goal 4: Patient will decrease her left shoulder pain to 4/10 when fastening her bra. Short Term Goal 5: Patient will decrease fascial restrictions to min-mod in her left shoulder region for increased mobiility needed to reach behind her back. Long Term Goals Time to Complete Long Term Goals: 6 weeks Long Term Goal 1: Patient will return to prior level of independence with all functional, leisure, and desired acitivties.   Long Term Goal 2: Patient will increase her left shoulder AROM to WNL for increased ability to reach into the bank teller  window and reach behind her back. Long Term Goal 3: Patient will increase left shoulder strength to 5/5 for increased ability to lift her grandson into the car. Long Term Goal 4: Patient will decrease her pain level to 1/10 when reaching out for the bank teller window with her left arm. Long Term Goal 5: Patient will decrease fascial restrictions to minimal for increased ability to reach behind her back.   Problem List Patient Active Problem List  Diagnosis  . PALPITATIONS  . Pain in joint, shoulder region  . Impingement syndrome of left shoulder  . Adhesive capsulitis of shoulder    End of  Session  Activity Tolerance: Patient tolerated treatment well General Behavior During Session: [facility] for tasks performed Cognition: [facility] for tasks performed  GO   Donja Tipping L. Laker Thompson, COTA/L 06/05/2012, 11:50 AM

## 2012-06-07 ENCOUNTER — Ambulatory Visit (HOSPITAL_COMMUNITY)
Admission: RE | Admit: 2012-06-07 | Discharge: 2012-06-07 | Disposition: A | Discharge status: Home / Self Care | Payer: BC Managed Care – PPO | Source: Ambulatory Visit | Attending: Orthopedic Surgery | Admitting: Orthopedic Surgery

## 2012-06-07 DIAGNOSIS — M6281 Muscle weakness (generalized): Secondary | ICD-10-CM | POA: Insufficient documentation

## 2012-06-07 DIAGNOSIS — M25512 Pain in left shoulder: Secondary | ICD-10-CM

## 2012-06-07 DIAGNOSIS — M7542 Impingement syndrome of left shoulder: Secondary | ICD-10-CM

## 2012-06-07 DIAGNOSIS — M25519 Pain in unspecified shoulder: Secondary | ICD-10-CM | POA: Insufficient documentation

## 2012-06-07 DIAGNOSIS — M25619 Stiffness of unspecified shoulder, not elsewhere classified: Secondary | ICD-10-CM | POA: Insufficient documentation

## 2012-06-07 DIAGNOSIS — M7502 Adhesive capsulitis of left shoulder: Secondary | ICD-10-CM

## 2012-06-07 DIAGNOSIS — IMO0001 Reserved for inherently not codable concepts without codable children: Secondary | ICD-10-CM | POA: Insufficient documentation

## 2012-06-07 NOTE — Progress Notes (Signed)
Occupational Therapy Treatment Patient Details  Name: AKYLA VAVREK MRN: [id number] Date of Birth: [birth year]  Today's Date: 06/07/2012 Time: 4098-1191 OT Time Calculation (min): 43 min Manual Therapy 850-911 21' Therapeutic Exercise [phone] 21'  Visit#: 11 of 24  Re-eval: 06/19/12    Authorization: n/a  Authorization Time Period:    Authorization Visit#:   of    Subjective Symptoms/Limitations Symptoms: S:  I have more discomfort than pain.  I notice I keep my shoulders up, I've been working on that. Pain Assessment Currently in Pain?: No/denies Pain Score: 0-No pain  Precautions/Restrictions     Exercise/Treatments Supine Protraction: PROM;Strengthening;15 reps Protraction Weight (lbs): 1 pounds Horizontal ABduction: PROM;Strengthening;15 reps Horizontal ABduction Weight (lbs): 1 External Rotation: PROM;Strengthening;15 reps External Rotation Weight (lbs): 1 Internal Rotation: PROM;Strengthening;15 reps Internal Rotation Weight (lbs): 1 Flexion: PROM;Strengthening;15 reps Shoulder Flexion Weight (lbs): 1 ABduction: PROM;Strengthening;15 reps Shoulder ABduction Weight (lbs): 1 Seated Protraction: Strengthening;10 reps Protraction Weight (lbs): 1 Horizontal ABduction: Strengthening;10 reps Horizontal ABduction Weight (lbs): 1 External Rotation: Strengthening;10 reps External Rotation Weight (lbs): 1 Internal Rotation: Strengthening;10 reps Internal Rotation Weight (lbs): 1 Flexion: Strengthening;10 reps Flexion Weight (lbs): 1 Abduction: Strengthening;10 reps ABduction Weight (lbs): 1 Standing External Rotation: Theraband;15 reps Theraband Level (Shoulder External Rotation): Level 3 (Green) Internal Rotation: Theraband;15 reps Theraband Level (Shoulder Internal Rotation): Level 3 (Green) Extension: Theraband;15 reps Theraband Level (Shoulder Extension): Level 3 (Green) Row: Theraband;15 reps Theraband Level (Shoulder Row): Level 3 (Green) Therapy  Ball Flexion: Other (comment) ABduction: Other (comment) Right/Left: Other (comment) (ball circles with green ball 5 each way) ROM / Strengthening / Isometric Strengthening UBE (Upper Arm Bike): 2' and 2' 1.0 Wall Wash: 3' "W" Arms: resume X to V Arms: resume Ball on Wall: 1'      Manual Therapy Manual Therapy: Myofascial release Myofascial Release: Myofascial release and manual stretching to left upper arm, shoulder, scapular, trapezius region, supscapularis, infraspinatus region to decrease pain and fascial restrictions and increase pain free mobility.  Occupational Therapy Assessment and Plan OT Assessment and Plan Clinical Impression Statement: A:  Added weights to seated exercises and switched from ball stretches with large ball to small green ball to focus on increasing IR ROM. OT Plan: P:  Increase reps with seated ex and increase to 2# in supine.   Goals Short Term Goals Time to Complete Short Term Goals: 3 weeks Short Term Goal 1: Patient will be educated on a HEP. Short Term Goal 2: Patient will increase her left shoulder PROM to WNL for increased ability to don shirts and coats with less pain. Short Term Goal 3: Patient will increase left shoulder strength to 4+/5 for increased ability to lift her grandson into his carseat. Short Term Goal 4: Patient will decrease her left shoulder pain to 4/10 when fastening her bra. Short Term Goal 5: Patient will decrease fascial restrictions to min-mod in her left shoulder region for increased mobiility needed to reach behind her back. Long Term Goals Time to Complete Long Term Goals: 6 weeks Long Term Goal 1: Patient will return to prior level of independence with all functional, leisure, and desired acitivties.   Long Term Goal 2: Patient will increase her left shoulder AROM to WNL for increased ability to reach into the bank teller  window and reach behind her back. Long Term Goal 3: Patient will increase left shoulder strength to  5/5 for increased ability to lift her grandson into the car. Long Term Goal 4: Patient will decrease her pain level  to 1/10 when reaching out for the bank teller window with her left arm. Long Term Goal 5: Patient will decrease fascial restrictions to minimal for increased ability to reach behind her back.   Problem List Patient Active Problem List  Diagnosis  . PALPITATIONS  . Pain in joint, shoulder region  . Impingement syndrome of left shoulder  . Adhesive capsulitis of shoulder    End of Session Activity Tolerance: Patient tolerated treatment well General Behavior During Session: [facility] for tasks performed Cognition: [facility] for tasks performed  GO   Keela Rubert L. Taleisha Kaczynski, COTA/L 06/07/2012, 9:41 AM

## 2012-06-12 ENCOUNTER — Ambulatory Visit (HOSPITAL_COMMUNITY)
Admission: RE | Admit: 2012-06-12 | Discharge: 2012-06-12 | Disposition: A | Discharge status: Home / Self Care | Payer: BC Managed Care – PPO | Source: Ambulatory Visit | Attending: Orthopedic Surgery | Admitting: Orthopedic Surgery

## 2012-06-12 DIAGNOSIS — M25512 Pain in left shoulder: Secondary | ICD-10-CM

## 2012-06-12 DIAGNOSIS — M7502 Adhesive capsulitis of left shoulder: Secondary | ICD-10-CM

## 2012-06-12 DIAGNOSIS — M7542 Impingement syndrome of left shoulder: Secondary | ICD-10-CM

## 2012-06-12 NOTE — Progress Notes (Signed)
Occupational Therapy Treatment Patient Details  Name: SYDNY SCHNITZLER MRN: [id number] Date of Birth: 05-17-1945  Today's Date: 06/12/2012 Time: 4098-1191 OT Time Calculation (min): 31 min Manual Therapy [phone] 15' Therapeutic Exercise 910-925 15'  Visit#: 12 of 24  Re-eval: 06/19/12    Authorization: n/a  Authorization Time Period:    Authorization Visit#:   of    Subjective Symptoms/Limitations Symptoms: S:  This is a really yucky day but I don't have any pain.  Precautions/Restrictions     Exercise/Treatments Supine Protraction: PROM;Strengthening;10 reps Protraction Weight (lbs): 2 Horizontal ABduction: PROM;Strengthening;10 reps Horizontal ABduction Weight (lbs): 2 External Rotation: PROM;Strengthening;10 reps External Rotation Weight (lbs): 2 Internal Rotation: PROM;Strengthening;10 reps Internal Rotation Weight (lbs): 2 Flexion: PROM;Strengthening;10 reps Shoulder Flexion Weight (lbs): 2 ABduction: PROM;Strengthening;10 reps Shoulder ABduction Weight (lbs): 2 Seated Protraction: Strengthening;12 reps Protraction Weight (lbs): 1 Horizontal ABduction: Strengthening;12 reps Horizontal ABduction Weight (lbs): 1 External Rotation: Strengthening;12 reps External Rotation Weight (lbs): 1 Internal Rotation: Strengthening;12 reps Internal Rotation Weight (lbs): 1 Flexion: Strengthening;12 reps Flexion Weight (lbs): 1 Abduction: Strengthening;12 reps ABduction Weight (lbs): 1 Standing External Rotation: Other (comment) (d/c to hep) ROM / Strengthening / Isometric Strengthening UBE (Upper Arm Bike): 3' forward 3' backwards  Wall Wash: 2' with 1# "W" Arms: x 12 X to V Arms: x 12 Ball on Wall: 1'      Manual Therapy Manual Therapy: Myofascial release Myofascial Release: Myofascial release and manual stretching to left upper arm, shoulder, scapular, trapezius region, subscapularis, infraspinatus region to decrease pain and fascial restridctions and increase  pain free mobility.  Occupational Therapy Assessment and Plan OT Assessment and Plan Clinical Impression Statement: A:  Increased supine weight to 2# and added 1# to wall wash. Rehab Potential: Good OT Plan: P:  Increase supine and seated reps.   Goals Short Term Goals Time to Complete Short Term Goals: 3 weeks Short Term Goal 1: Patient will be educated on a HEP. Short Term Goal 2: Patient will increase her left shoulder PROM to WNL for increased ability to don shirts and coats with less pain. Short Term Goal 3: Patient will increase left shoulder strength to 4+/5 for increased ability to lift her grandson into his carseat. Short Term Goal 4: Patient will decrease her left shoulder pain to 4/10 when fastening her bra. Short Term Goal 5: Patient will decrease fascial restrictions to min-mod in her left shoulder region for increased mobiility needed to reach behind her back. Long Term Goals Time to Complete Long Term Goals: 6 weeks Long Term Goal 1: Patient will return to prior level of independence with all functional, leisure, and desired acitivties.   Long Term Goal 2: Patient will increase her left shoulder AROM to WNL for increased ability to reach into the bank teller  window and reach behind her back. Long Term Goal 3: Patient will increase left shoulder strength to 5/5 for increased ability to lift her grandson into the car. Long Term Goal 4: Patient will decrease her pain level to 1/10 when reaching out for the bank teller window with her left arm. Long Term Goal 5: Patient will decrease fascial restrictions to minimal for increased ability to reach behind her back.   Problem List Patient Active Problem List  Diagnosis  . PALPITATIONS  . Pain in joint, shoulder region  . Impingement syndrome of left shoulder  . Adhesive capsulitis of shoulder    End of Session Activity Tolerance: Patient tolerated treatment well General Behavior During Session: [facility] for tasks  performed Cognition: [facility] for tasks performed  GO   Calene Paradiso L. Mickey Hebel, COTA/L 06/12/2012, 9:57 AM

## 2012-06-15 ENCOUNTER — Ambulatory Visit (HOSPITAL_COMMUNITY)
Admission: RE | Admit: 2012-06-15 | Discharge: 2012-06-15 | Disposition: A | Discharge status: Home / Self Care | Payer: BC Managed Care – PPO | Source: Ambulatory Visit | Attending: Orthopedic Surgery | Admitting: Orthopedic Surgery

## 2012-06-15 DIAGNOSIS — M25512 Pain in left shoulder: Secondary | ICD-10-CM

## 2012-06-15 DIAGNOSIS — M7542 Impingement syndrome of left shoulder: Secondary | ICD-10-CM

## 2012-06-15 DIAGNOSIS — M7502 Adhesive capsulitis of left shoulder: Secondary | ICD-10-CM

## 2012-06-15 NOTE — Progress Notes (Signed)
Occupational Therapy Treatment Patient Details  Name: GERALDY AKRIDGE MRN: [id number] Date of Birth: March 16, 1945  Today's Date: 06/15/2012 Time: 4098-1191 OT Time Calculation (min): 40 min Manual Therapy 850-908 18' Therapeutic Exercise 909-930 21'  Visit#: 13 of 24  Re-eval: 06/19/12    Authorization: n/a  Authorization Time Period:    Authorization Visit#:   of    Subjective Symptoms/Limitations Symptoms: S:  I have been working on getting behind my back at home. Pain Assessment Currently in Pain?: No/denies  Precautions/Restrictions     Exercise/Treatments Supine Protraction: PROM;Strengthening;12 reps Protraction Weight (lbs): 2 Horizontal ABduction: PROM;Strengthening;12 reps Horizontal ABduction Weight (lbs): 2 External Rotation: PROM;Strengthening;12 reps External Rotation Weight (lbs): 2 Internal Rotation: PROM;Strengthening;12 reps Internal Rotation Weight (lbs): 2 Flexion: PROM;Strengthening;12 reps Shoulder Flexion Weight (lbs): 2 ABduction: PROM;Strengthening;12 reps Shoulder ABduction Weight (lbs): 2 Seated Protraction: Strengthening;12 reps Protraction Weight (lbs): 1 Horizontal ABduction: Strengthening;12 reps Horizontal ABduction Weight (lbs): 1 External Rotation: Strengthening;12 reps External Rotation Weight (lbs): 1 Internal Rotation: Strengthening;12 reps Internal Rotation Weight (lbs): 1 Flexion: Strengthening;12 reps Flexion Weight (lbs): 1 Abduction: Strengthening;12 reps ABduction Weight (lbs): 1 ROM / Strengthening / Isometric Strengthening UBE (Upper Arm Bike): 3' forward 3' backwards 1.5 Wall Wash: 2' with 1# "W" Arms: x10 with 1# X to V Arms: x10 with 1# Ball on Wall: 1' flexion 1' abduction        Manual Therapy Manual Therapy: Myofascial release Myofascial Release: Myofascial release and manual stretching to left upper arm, shoulder, scapular, trapezius region, subscapularis, infraspinatus region to decrease pain and  fascial restridctions and increase pain free mobility.  Occupational Therapy Assessment and Plan OT Assessment and Plan Clinical Impression Statement: A:  Added 1' abduction with ball on the wall also added 1# to x to v and w-arms also added IR/ER exercise reaching behind her back and behind her head with 1# to increase AROM.   OT Plan: P:  Increase time with wall wash to increase functional endurance.   Goals Short Term Goals Time to Complete Short Term Goals: 3 weeks Short Term Goal 1: Patient will be educated on a HEP. Short Term Goal 2: Patient will increase her left shoulder PROM to WNL for increased ability to don shirts and coats with less pain. Short Term Goal 3: Patient will increase left shoulder strength to 4+/5 for increased ability to lift her grandson into his carseat. Short Term Goal 4: Patient will decrease her left shoulder pain to 4/10 when fastening her bra. Short Term Goal 5: Patient will decrease fascial restrictions to min-mod in her left shoulder region for increased mobiility needed to reach behind her back. Long Term Goals Time to Complete Long Term Goals: 6 weeks Long Term Goal 1: Patient will return to prior level of independence with all functional, leisure, and desired acitivties.   Long Term Goal 2: Patient will increase her left shoulder AROM to WNL for increased ability to reach into the bank teller  window and reach behind her back. Long Term Goal 3: Patient will increase left shoulder strength to 5/5 for increased ability to lift her grandson into the car. Long Term Goal 4: Patient will decrease her pain level to 1/10 when reaching out for the bank teller window with her left arm. Long Term Goal 5: Patient will decrease fascial restrictions to minimal for increased ability to reach behind her back.   Problem List Patient Active Problem List  Diagnosis  . PALPITATIONS  . Pain in joint, shoulder region  .  Impingement syndrome of left shoulder  . Adhesive  capsulitis of shoulder    End of Session Activity Tolerance: Patient tolerated treatment well General Behavior During Session: [facility] for tasks performed Cognition: [facility] for tasks performed  GO   Adasyn Mcadams L. Ziair Penson, COTA/L 06/15/2012, 9:36 AM

## 2012-06-20 ENCOUNTER — Ambulatory Visit (HOSPITAL_COMMUNITY)
Admission: RE | Admit: 2012-06-20 | Discharge: 2012-06-20 | Disposition: A | Discharge status: Home / Self Care | Payer: BC Managed Care – PPO | Source: Ambulatory Visit | Attending: Orthopedic Surgery | Admitting: Orthopedic Surgery

## 2012-06-20 DIAGNOSIS — M25512 Pain in left shoulder: Secondary | ICD-10-CM

## 2012-06-20 DIAGNOSIS — M7502 Adhesive capsulitis of left shoulder: Secondary | ICD-10-CM

## 2012-06-20 DIAGNOSIS — M7542 Impingement syndrome of left shoulder: Secondary | ICD-10-CM

## 2012-06-20 NOTE — Progress Notes (Signed)
Occupational Therapy Treatment Patient Details  Name: Veronica Snyder MRN: [id number] Date of Birth: 08-31-1945  Today's Date: 06/20/2012 Time: 7829-5621 OT Time Calculation (min): 41 min Manual Therapy 850-907 17' Therapeutic Exercise [phone] 23'  Visit#: 14 of 24  Re-eval: 06/19/12    Authorization: n/a  Authorization Time Period:    Authorization Visit#:   of    Subjective Symptoms/Limitations Symptoms: S:  I just feel soreness, i wish that would go away. Pain Assessment Currently in Pain?: No/denies  Precautions/Restrictions     Exercise/Treatments Supine Protraction: PROM;Strengthening;15 reps Protraction Weight (lbs): 2 Horizontal ABduction: PROM;Strengthening;15 reps Horizontal ABduction Weight (lbs): 2 External Rotation: PROM;Strengthening;15 reps External Rotation Weight (lbs): 2 Internal Rotation: PROM;Strengthening;15 reps Internal Rotation Weight (lbs): 2 Flexion: PROM;Strengthening;15 reps Shoulder Flexion Weight (lbs): 2 ABduction: PROM;Strengthening;15 reps Shoulder ABduction Weight (lbs): 2 Seated Protraction: Strengthening;10 reps Protraction Weight (lbs): 2 Horizontal ABduction: Strengthening;10 reps Horizontal ABduction Weight (lbs): 2 External Rotation: Strengthening;10 reps External Rotation Weight (lbs): 2 Internal Rotation: Strengthening;10 reps Internal Rotation Weight (lbs): 2 Flexion: Strengthening;10 reps Flexion Weight (lbs): 2 Abduction: Strengthening;10 reps ABduction Weight (lbs): 2 Therapy Ball Right/Left: Other (comment) (ball circles with green ball 5 each way) ROM / Strengthening / Isometric Strengthening UBE (Upper Arm Bike): 3' forward 3' backwards 2.0 Wall Wash: 2' with 1# "W" Arms: x10 with 2# X to V Arms: x10 with 2# Ball on Wall: 1' flexion 1' abduction         Manual Therapy Manual Therapy: Myofascial release Myofascial Release: Myofascial release and manual stretching to left upper arm, shoulder,  scapular, trapezius region, subscapularis, infraspinatus region to decrease pain and fascial restridctions and increase pain free mobility  Occupational Therapy Assessment and Plan OT Assessment and Plan Clinical Impression Statement: A:  Increased to 2# seated and with x to v and w arms, also increased time with wall wash.  Pt. with increase in IR and flexion with increased hold. OT Plan: P:  Reassess.   Goals Short Term Goals Time to Complete Short Term Goals: 3 weeks Short Term Goal 1: Patient will be educated on a HEP. Short Term Goal 2: Patient will increase her left shoulder PROM to WNL for increased ability to don shirts and coats with less pain. Short Term Goal 3: Patient will increase left shoulder strength to 4+/5 for increased ability to lift her grandson into his carseat. Short Term Goal 4: Patient will decrease her left shoulder pain to 4/10 when fastening her bra. Short Term Goal 5: Patient will decrease fascial restrictions to min-mod in her left shoulder region for increased mobiility needed to reach behind her back. Long Term Goals Time to Complete Long Term Goals: 6 weeks Long Term Goal 1: Patient will return to prior level of independence with all functional, leisure, and desired acitivties.   Long Term Goal 2: Patient will increase her left shoulder AROM to WNL for increased ability to reach into the bank teller  window and reach behind her back. Long Term Goal 3: Patient will increase left shoulder strength to 5/5 for increased ability to lift her grandson into the car. Long Term Goal 4: Patient will decrease her pain level to 1/10 when reaching out for the bank teller window with her left arm. Long Term Goal 5: Patient will decrease fascial restrictions to minimal for increased ability to reach behind her back.   Problem List Patient Active Problem List  Diagnosis  . PALPITATIONS  . Pain in joint, shoulder region  . Impingement syndrome  of left shoulder  . Adhesive  capsulitis of shoulder    End of Session Activity Tolerance: Patient tolerated treatment well General Behavior During Session: [facility] for tasks performed Cognition: [facility] for tasks performed  GO   Vilma Will L. Arneshia Ade, COTA/L 06/20/2012, 9:46 AM

## 2012-06-21 ENCOUNTER — Ambulatory Visit (HOSPITAL_COMMUNITY)
Admission: RE | Admit: 2012-06-21 | Discharge: 2012-06-21 | Disposition: A | Discharge status: Home / Self Care | Payer: BC Managed Care – PPO | Source: Ambulatory Visit | Attending: Orthopedic Surgery | Admitting: Orthopedic Surgery

## 2012-06-21 DIAGNOSIS — M25512 Pain in left shoulder: Secondary | ICD-10-CM

## 2012-06-21 DIAGNOSIS — M7542 Impingement syndrome of left shoulder: Secondary | ICD-10-CM

## 2012-06-21 DIAGNOSIS — M7502 Adhesive capsulitis of left shoulder: Secondary | ICD-10-CM

## 2012-06-21 NOTE — Evaluation (Signed)
Occupational Therapy Re-Evaluation & treatment  Patient Details  Name: Veronica Snyder MRN: [id number] Date of Birth: 05-Apr-1945  Today's Date: 06/21/2012 Time: 6213-0865 OT Time Calculation (min): 48 min MFR 845-905 15' Therex 905-910 5' Reassess 910-922 12' Therex 922-933 10'  Visit#: 15 of 24  Re-eval: 06/19/12     Authorization: n/a  Authorization Time Period:    Authorization Visit#:   of     Past Medical History:  Past Medical History  Diagnosis Date  . Palpitations     Short runs of SVT by event recording associated with patient's symptoms 2012   Past Surgical History:  Past Surgical History  Procedure Laterality Date  . Orif distal radius fracture  2006    Right  . Irrigation and debridement sebaceous cyst  2005  . Colonoscopy  2006    Negative  . Wisdom tooth extraction      Subjective Symptoms/Limitations Symptoms: S; My shoulder is feeling good. It's almost back to normal. I'm still having difficulty reaching back and behind me. Pain Assessment Currently in Pain?: No/denies  Precautions/Restrictions  Precautions Precautions: None   Assessment Additional Assessments LUE AROM (degrees) LUE Overall AROM Comments: assessed in standing.  ER/IR assessed in ABD Left Shoulder Flexion: 154 Degrees (last progress note: 140) Left Shoulder ABduction: 157 Degrees (last progress note: 121) Left Shoulder Internal Rotation: 55 Degrees (last progress note: 90 with shoulder ADD) Left Shoulder External Rotation: 109 Degrees (last progress note: 80)   LUE strength Assessed standing. ER/IR assessed in ABD at 90 degrees. Shoulder flexion: 5/5 Shoulder ABD: 4/5 Shoulder IR: 5/5 Shoulder ER: 5/5  Exercise/Treatments Supine Protraction: PROM;Strengthening;15 reps Protraction Weight (lbs): 2 Horizontal ABduction: PROM;Strengthening;15 reps Horizontal ABduction Weight (lbs): 2 External Rotation: PROM;Strengthening;15 reps External Rotation Weight (lbs):  2 Internal Rotation: PROM;Strengthening;15 reps Internal Rotation Weight (lbs): 2 Flexion: PROM;Strengthening;15 reps Shoulder Flexion Weight (lbs): 2 ABduction: PROM;Strengthening;15 reps Shoulder ABduction Weight (lbs): 2 Therapy Ball Right/Left: Other (comment) (5 ball circles with green ball; around waist and around head) ROM / Strengthening / Isometric Strengthening UBE (Upper Arm Bike): 3' forward 3' backwards 2.0       Manual Therapy Manual Therapy: Myofascial release Myofascial Release: Myofascial release and manual stretching to left upper arm, shoulder, scapular, trapezius region, subscapularis, infraspinatus region to decrease pain and fascial restridctions and increase pain free mobility  Occupational Therapy Assessment and Plan OT Assessment and Plan Clinical Impression Statement: A: See MD note for progress. Pt making great progress will reassess in 4 weeks for possible D/C. OT Plan: P: 2x week for 4 weeks. Add additional exercises to strengthen IR with shoulder ABD, reaching behind back and head to reach bra, increase weight to red weighted ball for left/right.   Goals Short Term Goals Time to Complete Short Term Goals: 3 weeks Short Term Goal 1: Patient will be educated on a HEP. Short Term Goal 2: Patient will increase her left shoulder PROM to WNL for increased ability to don shirts and coats with less pain. Short Term Goal 3: Patient will increase left shoulder strength to 4+/5 for increased ability to lift her grandson into his carseat. Short Term Goal 3 Progress: Met Short Term Goal 4: Patient will decrease her left shoulder pain to 4/10 when fastening her bra. Short Term Goal 4 Progress: Progressing toward goal Short Term Goal 5: Patient will decrease fascial restrictions to min-mod in her left shoulder region for increased mobiility needed to reach behind her back. Short Term Goal 5 Progress: Met Long  Term Goals Time to Complete Long Term Goals: 6 weeks Long  Term Goal 1: Patient will return to prior level of independence with all functional, leisure, and desired acitivties.   Long Term Goal 1 Progress: Met Long Term Goal 2: Patient will increase her left shoulder AROM to WNL for increased ability to reach into the bank teller  window and reach behind her back. Long Term Goal 2 Progress: Progressing toward goal Long Term Goal 3: Patient will increase left shoulder strength to 5/5 for increased ability to lift her grandson into the car. Long Term Goal 3 Progress: Progressing toward goal Long Term Goal 4: Patient will decrease her pain level to 1/10 when reaching out for the bank teller window with her left arm. Long Term Goal 4 Progress: Met Long Term Goal 5: Patient will decrease fascial restrictions to minimal for increased ability to reach behind her back.  Long Term Goal 5 Progress: Met  Problem List Patient Active Problem List  Diagnosis  . PALPITATIONS  . Pain in joint, shoulder region  . Impingement syndrome of left shoulder  . Adhesive capsulitis of shoulder    End of Session Activity Tolerance: Patient tolerated treatment well General Cognition: WFL for tasks performed   Limmie Patricia, OTR/L,CBIS   06/21/2012, 9:31 AM  Physician Documentation Your signature is required to indicate approval of the treatment plan as stated above.  Please sign and either send electronically or make a copy of this report for your files and return this physician signed original.  Please mark one 1.__approve of plan  2. ___approve of plan with the following conditions.   ______________________________                                                          _____________________ Physician Signature                                                                                                             Date

## 2012-06-27 ENCOUNTER — Ambulatory Visit (HOSPITAL_COMMUNITY)
Admission: RE | Admit: 2012-06-27 | Discharge: 2012-06-27 | Disposition: A | Discharge status: Home / Self Care | Payer: BC Managed Care – PPO | Source: Ambulatory Visit | Attending: Orthopedic Surgery | Admitting: Orthopedic Surgery

## 2012-06-27 DIAGNOSIS — M7542 Impingement syndrome of left shoulder: Secondary | ICD-10-CM

## 2012-06-27 DIAGNOSIS — M25512 Pain in left shoulder: Secondary | ICD-10-CM

## 2012-06-27 DIAGNOSIS — M7502 Adhesive capsulitis of left shoulder: Secondary | ICD-10-CM

## 2012-06-27 NOTE — Progress Notes (Signed)
Occupational Therapy Treatment Patient Details  Name: Veronica Snyder MRN: [id number] Date of Birth: 04/02/1945  Today's Date: 06/27/2012 Time: 8469-6295 OT Time Calculation (min): 29 min Manual Therapy 851- [phone] 14' Self care 905-920 15' Visit#: 16 of 24  Re-eval: 07/19/12     Subjective S:  I went to Dr. Rennis Chris yesterday, and he thinks Im doing great.  In the last week, I have been moving it much more.  I think I am ready to dc to a HEP. Pain Assessment Currently in Pain?: No/denies Pain Score: 0-No pain  Precautions/Restrictions   n/a  Exercise/Treatments Standing Other Standing Exercises: standing with arm abducted to 90, externally rotate to touch back of head, return to neutral, internally rotate, touch bra strap, return to neutrall  Manual Therapy Manual Therapy: Myofascial release Myofascial Release: Myofascial release and manual stretching to left upper arm, shoulder, scapular, trapezius region, subscapularis, infraspinatus region to decrease pain and fascial restridctions and increase pain free mobility  Occupational Therapy Assessment and Plan OT Assessment and Plan Clinical Impression Statement: A:  Patient requests dc this date.  Patient has met all short term and long term goals.  Her AROM is equal in her left and right arm for flexion, abduction, and external rotation.  Internal rotation remains limited, and is [facility].  OTR/L issued additional HEP for increasing external and interal rotation.   OT Plan: P:  DC this date.     Goals Short Term Goals Time to Complete Short Term Goals: 3 weeks Short Term Goal 1: Patient will be educated on a HEP. Short Term Goal 2: Patient will increase her left shoulder PROM to WNL for increased ability to don shirts and coats with less pain. Short Term Goal 3: Patient will increase left shoulder strength to 4+/5 for increased ability to lift her grandson into his carseat. Short Term Goal 4: Patient will decrease her left shoulder  pain to 4/10 when fastening her bra. Short Term Goal 4 Progress: Met Short Term Goal 5: Patient will decrease fascial restrictions to min-mod in her left shoulder region for increased mobiility needed to reach behind her back. Long Term Goals Time to Complete Long Term Goals: 6 weeks Long Term Goal 1: Patient will return to prior level of independence with all functional, leisure, and desired acitivties.   Long Term Goal 2: Patient will increase her left shoulder AROM to WNL for increased ability to reach into the bank teller  window and reach behind her back. Long Term Goal 2 Progress: Met Long Term Goal 3: Patient will increase left shoulder strength to 5/5 for increased ability to lift her grandson into the car. Long Term Goal 3 Progress: Met Long Term Goal 4: Patient will decrease her pain level to 1/10 when reaching out for the bank teller window with her left arm. Long Term Goal 4 Progress: Met Long Term Goal 5: Patient will decrease fascial restrictions to minimal for increased ability to reach behind her back.   Problem List Patient Active Problem List  Diagnosis  . PALPITATIONS  . Pain in joint, shoulder region  . Impingement syndrome of left shoulder  . Adhesive capsulitis of shoulder    End of Session Activity Tolerance: Patient tolerated treatment well General Behavior During Therapy: WFL for tasks assessed/performed Cognition: WFL for tasks performed OT Plan of Care OT Home Exercise Plan: standing with arm abducted to 90, externally rotate to touch back of head, return to neutral, internally rotate, touch bra strap, return to neutral Consulted  and Agree with Plan of Care: Patient  GO    Pansey Pinheiro, OTR/L  06/27/2012, 9:29 AM

## 2012-06-30 ENCOUNTER — Ambulatory Visit (HOSPITAL_COMMUNITY): Payer: BC Managed Care – PPO

## 2012-07-04 ENCOUNTER — Ambulatory Visit (HOSPITAL_COMMUNITY): Payer: BC Managed Care – PPO

## 2012-07-06 ENCOUNTER — Ambulatory Visit (HOSPITAL_COMMUNITY): Payer: BC Managed Care – PPO

## 2012-07-11 ENCOUNTER — Ambulatory Visit (HOSPITAL_COMMUNITY): Payer: BC Managed Care – PPO

## 2012-07-13 ENCOUNTER — Ambulatory Visit (HOSPITAL_COMMUNITY): Payer: BC Managed Care – PPO | Admitting: Specialist

## 2012-07-18 ENCOUNTER — Ambulatory Visit (HOSPITAL_COMMUNITY): Payer: BC Managed Care – PPO | Admitting: Specialist

## 2012-07-20 ENCOUNTER — Ambulatory Visit (HOSPITAL_COMMUNITY): Payer: BC Managed Care – PPO

## 2013-01-08 DIAGNOSIS — N390 Urinary tract infection, site not specified: Secondary | ICD-10-CM | POA: Diagnosis not present

## 2013-01-17 DIAGNOSIS — Z1231 Encounter for screening mammogram for malignant neoplasm of breast: Secondary | ICD-10-CM | POA: Diagnosis not present

## 2013-01-19 DIAGNOSIS — R300 Dysuria: Secondary | ICD-10-CM | POA: Diagnosis not present

## 2013-01-29 DIAGNOSIS — R351 Nocturia: Secondary | ICD-10-CM | POA: Diagnosis not present

## 2013-01-29 DIAGNOSIS — N3020 Other chronic cystitis without hematuria: Secondary | ICD-10-CM | POA: Diagnosis not present

## 2013-02-12 DIAGNOSIS — N3020 Other chronic cystitis without hematuria: Secondary | ICD-10-CM | POA: Diagnosis not present

## 2013-02-12 DIAGNOSIS — R351 Nocturia: Secondary | ICD-10-CM | POA: Diagnosis not present

## 2013-04-25 DIAGNOSIS — H43399 Other vitreous opacities, unspecified eye: Secondary | ICD-10-CM | POA: Diagnosis not present

## 2013-04-25 DIAGNOSIS — H2510 Age-related nuclear cataract, unspecified eye: Secondary | ICD-10-CM | POA: Diagnosis not present

## 2013-05-14 DIAGNOSIS — J309 Allergic rhinitis, unspecified: Secondary | ICD-10-CM | POA: Diagnosis not present

## 2013-05-14 DIAGNOSIS — J328 Other chronic sinusitis: Secondary | ICD-10-CM | POA: Diagnosis not present

## 2013-05-23 DIAGNOSIS — Z79899 Other long term (current) drug therapy: Secondary | ICD-10-CM | POA: Diagnosis not present

## 2013-05-23 DIAGNOSIS — G43719 Chronic migraine without aura, intractable, without status migrainosus: Secondary | ICD-10-CM | POA: Diagnosis not present

## 2013-05-23 DIAGNOSIS — Z124 Encounter for screening for malignant neoplasm of cervix: Secondary | ICD-10-CM | POA: Diagnosis not present

## 2013-05-23 DIAGNOSIS — Z01419 Encounter for gynecological examination (general) (routine) without abnormal findings: Secondary | ICD-10-CM | POA: Diagnosis not present

## 2013-05-23 DIAGNOSIS — G43019 Migraine without aura, intractable, without status migrainosus: Secondary | ICD-10-CM | POA: Diagnosis not present

## 2013-06-01 DIAGNOSIS — D2370 Other benign neoplasm of skin of unspecified lower limb, including hip: Secondary | ICD-10-CM | POA: Diagnosis not present

## 2013-06-01 DIAGNOSIS — D239 Other benign neoplasm of skin, unspecified: Secondary | ICD-10-CM | POA: Diagnosis not present

## 2013-06-01 DIAGNOSIS — D2330 Other benign neoplasm of skin of unspecified part of face: Secondary | ICD-10-CM | POA: Diagnosis not present

## 2013-06-01 DIAGNOSIS — Z85828 Personal history of other malignant neoplasm of skin: Secondary | ICD-10-CM | POA: Diagnosis not present

## 2013-06-01 DIAGNOSIS — L821 Other seborrheic keratosis: Secondary | ICD-10-CM | POA: Diagnosis not present

## 2013-06-01 DIAGNOSIS — L819 Disorder of pigmentation, unspecified: Secondary | ICD-10-CM | POA: Diagnosis not present

## 2013-06-01 DIAGNOSIS — D1801 Hemangioma of skin and subcutaneous tissue: Secondary | ICD-10-CM | POA: Diagnosis not present

## 2013-06-01 DIAGNOSIS — I789 Disease of capillaries, unspecified: Secondary | ICD-10-CM | POA: Diagnosis not present

## 2013-10-31 DIAGNOSIS — L03019 Cellulitis of unspecified finger: Secondary | ICD-10-CM | POA: Diagnosis not present

## 2013-11-23 DIAGNOSIS — G43019 Migraine without aura, intractable, without status migrainosus: Secondary | ICD-10-CM | POA: Diagnosis not present

## 2013-11-23 DIAGNOSIS — G43719 Chronic migraine without aura, intractable, without status migrainosus: Secondary | ICD-10-CM | POA: Diagnosis not present

## 2014-01-11 DIAGNOSIS — F411 Generalized anxiety disorder: Secondary | ICD-10-CM | POA: Diagnosis not present

## 2014-01-21 DIAGNOSIS — Z1231 Encounter for screening mammogram for malignant neoplasm of breast: Secondary | ICD-10-CM | POA: Diagnosis not present

## 2014-01-21 DIAGNOSIS — M810 Age-related osteoporosis without current pathological fracture: Secondary | ICD-10-CM | POA: Diagnosis not present

## 2014-02-12 DIAGNOSIS — F411 Generalized anxiety disorder: Secondary | ICD-10-CM | POA: Diagnosis not present

## 2014-04-30 DIAGNOSIS — F411 Generalized anxiety disorder: Secondary | ICD-10-CM | POA: Diagnosis not present

## 2014-05-07 DIAGNOSIS — Z79899 Other long term (current) drug therapy: Secondary | ICD-10-CM | POA: Diagnosis not present

## 2014-05-07 DIAGNOSIS — R51 Headache: Secondary | ICD-10-CM | POA: Diagnosis not present

## 2014-05-07 DIAGNOSIS — G43719 Chronic migraine without aura, intractable, without status migrainosus: Secondary | ICD-10-CM | POA: Diagnosis not present

## 2014-05-07 DIAGNOSIS — G43019 Migraine without aura, intractable, without status migrainosus: Secondary | ICD-10-CM | POA: Diagnosis not present

## 2014-05-09 DIAGNOSIS — J300 Vasomotor rhinitis: Secondary | ICD-10-CM | POA: Diagnosis not present

## 2014-05-10 ENCOUNTER — Ambulatory Visit (HOSPITAL_COMMUNITY)
Admission: RE | Admit: 2014-05-10 | Discharge: 2014-05-10 | Disposition: A | Discharge status: Home / Self Care | Payer: Medicare Other | Source: Ambulatory Visit | Attending: Internal Medicine | Admitting: Internal Medicine

## 2014-05-10 ENCOUNTER — Other Ambulatory Visit (HOSPITAL_COMMUNITY): Payer: Self-pay | Admitting: Internal Medicine

## 2014-05-10 DIAGNOSIS — S8265XA Nondisplaced fracture of lateral malleolus of left fibula, initial encounter for closed fracture: Secondary | ICD-10-CM | POA: Insufficient documentation

## 2014-05-10 DIAGNOSIS — M25572 Pain in left ankle and joints of left foot: Secondary | ICD-10-CM | POA: Diagnosis not present

## 2014-05-10 DIAGNOSIS — Y929 Unspecified place or not applicable: Secondary | ICD-10-CM | POA: Diagnosis not present

## 2014-05-10 DIAGNOSIS — Y939 Activity, unspecified: Secondary | ICD-10-CM | POA: Diagnosis not present

## 2014-05-13 ENCOUNTER — Ambulatory Visit (INDEPENDENT_AMBULATORY_CARE_PROVIDER_SITE_OTHER): Payer: Medicare Other | Admitting: Orthopedic Surgery

## 2014-05-13 ENCOUNTER — Encounter: Payer: Self-pay | Admitting: Orthopedic Surgery

## 2014-05-13 VITALS — Resp 18 | Ht 69.0 in | Wt 171.0 lb

## 2014-05-13 DIAGNOSIS — S8262XA Displaced fracture of lateral malleolus of left fibula, initial encounter for closed fracture: Secondary | ICD-10-CM | POA: Diagnosis not present

## 2014-05-13 NOTE — Patient Instructions (Signed)
Limited activity   Wear brace x 4 weeks

## 2014-05-13 NOTE — Progress Notes (Signed)
Patient ID: Veronica Snyder, female   DOB: 1945/12/30, 69 y.o.   MRN: [id number]  Chief Complaint  Patient presents with  . Ankle Injury    left ankle fx, doi 05/09/14    HPI Veronica Snyder is a 69 y.o. female.  The patient was walking and she had a short little boot her ankle rolled and she felt a pop and she has some trouble weightbearing went to the hospital for evaluation of the pain and swelling was found to have an avulsion fracture of the left fibula she was treated and sent home she comes in now walking with no devices on or support other than Ace wrap complaining of mild discomfort some swelling and no real difficulties weightbearing. HPI  Review of Systems No numbness or tingling related to the foot no other musculoskeletal joint complaints skin changes of discoloration from the bruising  Past Medical History  Diagnosis Date  . Palpitations     Short runs of SVT by event recording associated with patient's symptoms 2012    Past Surgical History  Procedure Laterality Date  . Orif distal radius fracture  2006    Right  . Irrigation and debridement sebaceous cyst  2005  . Colonoscopy  2006    Negative  . Wisdom tooth extraction      Family History  Problem Relation Age of Onset  . Alzheimer's disease Father     Social History History  Substance Use Topics  . Smoking status: Unknown If Ever Smoked  . Smokeless tobacco: Not on file  . Alcohol Use: No    Allergies  Allergen Reactions  . Penicillins     Current Outpatient Prescriptions  Medication Sig Dispense Refill  . azelastine (ASTELIN) 137 MCG/SPRAY nasal spray Place 1 spray into the nose 2 (two) times daily. Use in each nostril as directed     . calcium citrate-vitamin D (CITRACAL+D) 315-200 MG-UNIT per tablet Take 2 tablets by mouth 2 (two) times daily.      . Cholecalciferol (VITAMIN D) 2000 UNITS CAPS Take 1 capsule by mouth daily.      . Desloratadine (CLARINEX PO) Take by mouth.    . Fexofenadine  HCl (ALLEGRA PO) Take by mouth.    . mometasone (NASONEX) 50 MCG/ACT nasal spray Place 1 spray into the nose daily.      . Montelukast Sodium (SINGULAIR PO) Take by mouth.    . topiramate (TOPAMAX) 50 MG tablet Take 50 mg by mouth.     No current facility-administered medications for this visit.       Physical Exam Resp. rate 18, height 5\' 9"  (1.753 m), weight 171 lb (77.565 kg). Physical Exam The patient is well developed well nourished and well groomed. Orientation to person place and time is normal  Mood is pleasant. Ambulatory status normal gait pattern Left ankle tenderness over the fibula collateral ligaments are stable skin Gillies tendon nontender medial side nontender ankle has a 1+ drawer with a firm endpoint strength is normal skin is bruised and ecchymotic pulses are good sensation is normal balance is good  Data Reviewed I interpret the hospital x-ray is avulsion fracture [email or web address]   Assessment    Encounter Diagnosis  Name Primary?  Marland Kitchen Avulsion fracture of lateral malleolus of left fibula, closed, initial encounter Yes        Plan    Recommend ASO brace and follow-up in 3-4 weeks if still having trouble otherwise she can follow-up as needed

## 2014-05-15 ENCOUNTER — Other Ambulatory Visit: Payer: Self-pay | Admitting: Dermatology

## 2014-05-15 DIAGNOSIS — L820 Inflamed seborrheic keratosis: Secondary | ICD-10-CM | POA: Diagnosis not present

## 2014-05-15 DIAGNOSIS — L570 Actinic keratosis: Secondary | ICD-10-CM | POA: Diagnosis not present

## 2014-05-15 DIAGNOSIS — D2272 Melanocytic nevi of left lower limb, including hip: Secondary | ICD-10-CM | POA: Diagnosis not present

## 2014-05-15 DIAGNOSIS — Z85828 Personal history of other malignant neoplasm of skin: Secondary | ICD-10-CM | POA: Diagnosis not present

## 2014-05-15 DIAGNOSIS — D2239 Melanocytic nevi of other parts of face: Secondary | ICD-10-CM | POA: Diagnosis not present

## 2014-05-15 DIAGNOSIS — D2262 Melanocytic nevi of left upper limb, including shoulder: Secondary | ICD-10-CM | POA: Diagnosis not present

## 2014-05-15 DIAGNOSIS — L821 Other seborrheic keratosis: Secondary | ICD-10-CM | POA: Diagnosis not present

## 2014-05-15 DIAGNOSIS — D2371 Other benign neoplasm of skin of right lower limb, including hip: Secondary | ICD-10-CM | POA: Diagnosis not present

## 2014-05-15 DIAGNOSIS — B078 Other viral warts: Secondary | ICD-10-CM | POA: Diagnosis not present

## 2014-05-15 DIAGNOSIS — D225 Melanocytic nevi of trunk: Secondary | ICD-10-CM | POA: Diagnosis not present

## 2014-05-15 DIAGNOSIS — D0472 Carcinoma in situ of skin of left lower limb, including hip: Secondary | ICD-10-CM | POA: Diagnosis not present

## 2014-05-22 DIAGNOSIS — H2513 Age-related nuclear cataract, bilateral: Secondary | ICD-10-CM | POA: Diagnosis not present

## 2014-05-22 DIAGNOSIS — H524 Presbyopia: Secondary | ICD-10-CM | POA: Diagnosis not present

## 2014-05-28 DIAGNOSIS — D0472 Carcinoma in situ of skin of left lower limb, including hip: Secondary | ICD-10-CM | POA: Diagnosis not present

## 2014-05-28 DIAGNOSIS — Z1389 Encounter for screening for other disorder: Secondary | ICD-10-CM | POA: Diagnosis not present

## 2014-05-28 DIAGNOSIS — Z131 Encounter for screening for diabetes mellitus: Secondary | ICD-10-CM | POA: Diagnosis not present

## 2014-05-28 DIAGNOSIS — Z85828 Personal history of other malignant neoplasm of skin: Secondary | ICD-10-CM | POA: Diagnosis not present

## 2014-05-28 DIAGNOSIS — Z130 Encounter for screening for diseases of the blood and blood-forming organs and certain disorders involving the immune mechanism: Secondary | ICD-10-CM | POA: Diagnosis not present

## 2014-06-10 DIAGNOSIS — Z01419 Encounter for gynecological examination (general) (routine) without abnormal findings: Secondary | ICD-10-CM | POA: Diagnosis not present

## 2014-06-10 DIAGNOSIS — Z124 Encounter for screening for malignant neoplasm of cervix: Secondary | ICD-10-CM | POA: Diagnosis not present

## 2014-07-22 ENCOUNTER — Encounter (INDEPENDENT_AMBULATORY_CARE_PROVIDER_SITE_OTHER): Payer: Self-pay | Admitting: *Deleted

## 2014-09-20 DIAGNOSIS — H16292 Other keratoconjunctivitis, left eye: Secondary | ICD-10-CM | POA: Diagnosis not present

## 2014-10-07 ENCOUNTER — Ambulatory Visit (INDEPENDENT_AMBULATORY_CARE_PROVIDER_SITE_OTHER): Payer: Medicare Other | Admitting: Licensed Clinical Social Worker

## 2014-10-07 DIAGNOSIS — F411 Generalized anxiety disorder: Secondary | ICD-10-CM | POA: Diagnosis not present

## 2014-10-07 DIAGNOSIS — F321 Major depressive disorder, single episode, moderate: Secondary | ICD-10-CM

## 2014-10-15 ENCOUNTER — Ambulatory Visit (INDEPENDENT_AMBULATORY_CARE_PROVIDER_SITE_OTHER): Payer: Medicare Other | Admitting: Licensed Clinical Social Worker

## 2014-10-15 DIAGNOSIS — F411 Generalized anxiety disorder: Secondary | ICD-10-CM | POA: Diagnosis not present

## 2014-10-15 DIAGNOSIS — F321 Major depressive disorder, single episode, moderate: Secondary | ICD-10-CM

## 2014-10-29 DIAGNOSIS — F411 Generalized anxiety disorder: Secondary | ICD-10-CM | POA: Diagnosis not present

## 2014-10-29 DIAGNOSIS — F320 Major depressive disorder, single episode, mild: Secondary | ICD-10-CM | POA: Diagnosis not present

## 2014-10-29 DIAGNOSIS — F4323 Adjustment disorder with mixed anxiety and depressed mood: Secondary | ICD-10-CM | POA: Diagnosis not present

## 2014-11-05 DIAGNOSIS — F4323 Adjustment disorder with mixed anxiety and depressed mood: Secondary | ICD-10-CM | POA: Diagnosis not present

## 2014-11-05 DIAGNOSIS — F320 Major depressive disorder, single episode, mild: Secondary | ICD-10-CM | POA: Diagnosis not present

## 2014-11-05 DIAGNOSIS — F411 Generalized anxiety disorder: Secondary | ICD-10-CM | POA: Diagnosis not present

## 2014-11-19 DIAGNOSIS — F411 Generalized anxiety disorder: Secondary | ICD-10-CM | POA: Diagnosis not present

## 2014-11-19 DIAGNOSIS — F4323 Adjustment disorder with mixed anxiety and depressed mood: Secondary | ICD-10-CM | POA: Diagnosis not present

## 2014-11-19 DIAGNOSIS — F320 Major depressive disorder, single episode, mild: Secondary | ICD-10-CM | POA: Diagnosis not present

## 2014-11-26 DIAGNOSIS — G43019 Migraine without aura, intractable, without status migrainosus: Secondary | ICD-10-CM | POA: Diagnosis not present

## 2014-11-26 DIAGNOSIS — G43719 Chronic migraine without aura, intractable, without status migrainosus: Secondary | ICD-10-CM | POA: Diagnosis not present

## 2014-12-05 DIAGNOSIS — F320 Major depressive disorder, single episode, mild: Secondary | ICD-10-CM | POA: Diagnosis not present

## 2014-12-05 DIAGNOSIS — F4323 Adjustment disorder with mixed anxiety and depressed mood: Secondary | ICD-10-CM | POA: Diagnosis not present

## 2014-12-05 DIAGNOSIS — F411 Generalized anxiety disorder: Secondary | ICD-10-CM | POA: Diagnosis not present

## 2014-12-31 DIAGNOSIS — F411 Generalized anxiety disorder: Secondary | ICD-10-CM | POA: Diagnosis not present

## 2014-12-31 DIAGNOSIS — F4323 Adjustment disorder with mixed anxiety and depressed mood: Secondary | ICD-10-CM | POA: Diagnosis not present

## 2014-12-31 DIAGNOSIS — F320 Major depressive disorder, single episode, mild: Secondary | ICD-10-CM | POA: Diagnosis not present

## 2015-01-13 DIAGNOSIS — R202 Paresthesia of skin: Secondary | ICD-10-CM | POA: Diagnosis not present

## 2015-01-13 DIAGNOSIS — G44229 Chronic tension-type headache, not intractable: Secondary | ICD-10-CM | POA: Diagnosis not present

## 2015-01-13 DIAGNOSIS — J310 Chronic rhinitis: Secondary | ICD-10-CM | POA: Diagnosis not present

## 2015-01-13 DIAGNOSIS — G4489 Other headache syndrome: Secondary | ICD-10-CM | POA: Diagnosis not present

## 2015-01-13 DIAGNOSIS — R201 Hypoesthesia of skin: Secondary | ICD-10-CM | POA: Diagnosis not present

## 2015-01-20 DIAGNOSIS — J342 Deviated nasal septum: Secondary | ICD-10-CM | POA: Diagnosis not present

## 2015-01-20 DIAGNOSIS — J309 Allergic rhinitis, unspecified: Secondary | ICD-10-CM | POA: Diagnosis not present

## 2015-01-20 DIAGNOSIS — R51 Headache: Secondary | ICD-10-CM | POA: Diagnosis not present

## 2015-01-20 DIAGNOSIS — J3489 Other specified disorders of nose and nasal sinuses: Secondary | ICD-10-CM | POA: Diagnosis not present

## 2015-01-20 DIAGNOSIS — J343 Hypertrophy of nasal turbinates: Secondary | ICD-10-CM | POA: Diagnosis not present

## 2015-01-23 DIAGNOSIS — Z1231 Encounter for screening mammogram for malignant neoplasm of breast: Secondary | ICD-10-CM | POA: Diagnosis not present

## 2015-02-07 DIAGNOSIS — R0981 Nasal congestion: Secondary | ICD-10-CM | POA: Diagnosis not present

## 2015-02-07 DIAGNOSIS — J3489 Other specified disorders of nose and nasal sinuses: Secondary | ICD-10-CM | POA: Diagnosis not present

## 2015-02-07 DIAGNOSIS — R51 Headache: Secondary | ICD-10-CM | POA: Diagnosis not present

## 2015-02-07 DIAGNOSIS — J343 Hypertrophy of nasal turbinates: Secondary | ICD-10-CM | POA: Diagnosis not present

## 2015-02-07 DIAGNOSIS — J309 Allergic rhinitis, unspecified: Secondary | ICD-10-CM | POA: Diagnosis not present

## 2015-02-07 DIAGNOSIS — J342 Deviated nasal septum: Secondary | ICD-10-CM | POA: Diagnosis not present

## 2015-02-27 DIAGNOSIS — G44229 Chronic tension-type headache, not intractable: Secondary | ICD-10-CM | POA: Diagnosis not present

## 2015-02-27 DIAGNOSIS — J300 Vasomotor rhinitis: Secondary | ICD-10-CM | POA: Diagnosis not present

## 2015-02-27 DIAGNOSIS — R51 Headache: Secondary | ICD-10-CM | POA: Diagnosis not present

## 2015-02-27 DIAGNOSIS — R202 Paresthesia of skin: Secondary | ICD-10-CM | POA: Diagnosis not present

## 2015-03-31 DIAGNOSIS — F4323 Adjustment disorder with mixed anxiety and depressed mood: Secondary | ICD-10-CM | POA: Diagnosis not present

## 2015-03-31 DIAGNOSIS — F320 Major depressive disorder, single episode, mild: Secondary | ICD-10-CM | POA: Diagnosis not present

## 2015-03-31 DIAGNOSIS — F411 Generalized anxiety disorder: Secondary | ICD-10-CM | POA: Diagnosis not present

## 2015-04-07 DIAGNOSIS — F411 Generalized anxiety disorder: Secondary | ICD-10-CM | POA: Diagnosis not present

## 2015-04-07 DIAGNOSIS — F320 Major depressive disorder, single episode, mild: Secondary | ICD-10-CM | POA: Diagnosis not present

## 2015-04-07 DIAGNOSIS — F4323 Adjustment disorder with mixed anxiety and depressed mood: Secondary | ICD-10-CM | POA: Diagnosis not present

## 2015-04-10 DIAGNOSIS — G44219 Episodic tension-type headache, not intractable: Secondary | ICD-10-CM | POA: Diagnosis not present

## 2015-04-10 DIAGNOSIS — R202 Paresthesia of skin: Secondary | ICD-10-CM | POA: Diagnosis not present

## 2015-04-10 DIAGNOSIS — J310 Chronic rhinitis: Secondary | ICD-10-CM | POA: Diagnosis not present

## 2015-04-14 DIAGNOSIS — F411 Generalized anxiety disorder: Secondary | ICD-10-CM | POA: Diagnosis not present

## 2015-04-14 DIAGNOSIS — F320 Major depressive disorder, single episode, mild: Secondary | ICD-10-CM | POA: Diagnosis not present

## 2015-04-14 DIAGNOSIS — F4323 Adjustment disorder with mixed anxiety and depressed mood: Secondary | ICD-10-CM | POA: Diagnosis not present

## 2015-04-21 DIAGNOSIS — F4323 Adjustment disorder with mixed anxiety and depressed mood: Secondary | ICD-10-CM | POA: Diagnosis not present

## 2015-04-21 DIAGNOSIS — F411 Generalized anxiety disorder: Secondary | ICD-10-CM | POA: Diagnosis not present

## 2015-04-21 DIAGNOSIS — F320 Major depressive disorder, single episode, mild: Secondary | ICD-10-CM | POA: Diagnosis not present

## 2015-04-28 DIAGNOSIS — F411 Generalized anxiety disorder: Secondary | ICD-10-CM | POA: Diagnosis not present

## 2015-04-28 DIAGNOSIS — F320 Major depressive disorder, single episode, mild: Secondary | ICD-10-CM | POA: Diagnosis not present

## 2015-04-28 DIAGNOSIS — F4323 Adjustment disorder with mixed anxiety and depressed mood: Secondary | ICD-10-CM | POA: Diagnosis not present

## 2015-05-05 DIAGNOSIS — F4323 Adjustment disorder with mixed anxiety and depressed mood: Secondary | ICD-10-CM | POA: Diagnosis not present

## 2015-05-05 DIAGNOSIS — F411 Generalized anxiety disorder: Secondary | ICD-10-CM | POA: Diagnosis not present

## 2015-05-05 DIAGNOSIS — F320 Major depressive disorder, single episode, mild: Secondary | ICD-10-CM | POA: Diagnosis not present

## 2015-05-08 DIAGNOSIS — J300 Vasomotor rhinitis: Secondary | ICD-10-CM | POA: Diagnosis not present

## 2015-05-12 DIAGNOSIS — F320 Major depressive disorder, single episode, mild: Secondary | ICD-10-CM | POA: Diagnosis not present

## 2015-05-12 DIAGNOSIS — F4323 Adjustment disorder with mixed anxiety and depressed mood: Secondary | ICD-10-CM | POA: Diagnosis not present

## 2015-05-12 DIAGNOSIS — F411 Generalized anxiety disorder: Secondary | ICD-10-CM | POA: Diagnosis not present

## 2015-05-19 DIAGNOSIS — F4323 Adjustment disorder with mixed anxiety and depressed mood: Secondary | ICD-10-CM | POA: Diagnosis not present

## 2015-05-19 DIAGNOSIS — F320 Major depressive disorder, single episode, mild: Secondary | ICD-10-CM | POA: Diagnosis not present

## 2015-05-19 DIAGNOSIS — F411 Generalized anxiety disorder: Secondary | ICD-10-CM | POA: Diagnosis not present

## 2015-06-02 DIAGNOSIS — F4323 Adjustment disorder with mixed anxiety and depressed mood: Secondary | ICD-10-CM | POA: Diagnosis not present

## 2015-06-02 DIAGNOSIS — F411 Generalized anxiety disorder: Secondary | ICD-10-CM | POA: Diagnosis not present

## 2015-06-02 DIAGNOSIS — F320 Major depressive disorder, single episode, mild: Secondary | ICD-10-CM | POA: Diagnosis not present

## 2015-06-09 DIAGNOSIS — F320 Major depressive disorder, single episode, mild: Secondary | ICD-10-CM | POA: Diagnosis not present

## 2015-06-09 DIAGNOSIS — F4323 Adjustment disorder with mixed anxiety and depressed mood: Secondary | ICD-10-CM | POA: Diagnosis not present

## 2015-06-09 DIAGNOSIS — F411 Generalized anxiety disorder: Secondary | ICD-10-CM | POA: Diagnosis not present

## 2015-07-02 DIAGNOSIS — H43393 Other vitreous opacities, bilateral: Secondary | ICD-10-CM | POA: Diagnosis not present

## 2015-07-02 DIAGNOSIS — H52203 Unspecified astigmatism, bilateral: Secondary | ICD-10-CM | POA: Diagnosis not present

## 2015-07-02 DIAGNOSIS — H2513 Age-related nuclear cataract, bilateral: Secondary | ICD-10-CM | POA: Diagnosis not present

## 2015-07-02 DIAGNOSIS — H524 Presbyopia: Secondary | ICD-10-CM | POA: Diagnosis not present

## 2015-07-02 DIAGNOSIS — H5213 Myopia, bilateral: Secondary | ICD-10-CM | POA: Diagnosis not present

## 2015-07-09 DIAGNOSIS — R202 Paresthesia of skin: Secondary | ICD-10-CM | POA: Diagnosis not present

## 2015-07-09 DIAGNOSIS — G44219 Episodic tension-type headache, not intractable: Secondary | ICD-10-CM | POA: Diagnosis not present

## 2015-07-09 DIAGNOSIS — J300 Vasomotor rhinitis: Secondary | ICD-10-CM | POA: Diagnosis not present

## 2015-07-09 DIAGNOSIS — R51 Headache: Secondary | ICD-10-CM | POA: Diagnosis not present

## 2015-07-22 DIAGNOSIS — R635 Abnormal weight gain: Secondary | ICD-10-CM | POA: Diagnosis not present

## 2015-07-23 DIAGNOSIS — I788 Other diseases of capillaries: Secondary | ICD-10-CM | POA: Diagnosis not present

## 2015-07-23 DIAGNOSIS — B078 Other viral warts: Secondary | ICD-10-CM | POA: Diagnosis not present

## 2015-07-23 DIAGNOSIS — Z85828 Personal history of other malignant neoplasm of skin: Secondary | ICD-10-CM | POA: Diagnosis not present

## 2015-07-23 DIAGNOSIS — L821 Other seborrheic keratosis: Secondary | ICD-10-CM | POA: Diagnosis not present

## 2015-07-23 DIAGNOSIS — D224 Melanocytic nevi of scalp and neck: Secondary | ICD-10-CM | POA: Diagnosis not present

## 2015-07-23 DIAGNOSIS — D225 Melanocytic nevi of trunk: Secondary | ICD-10-CM | POA: Diagnosis not present

## 2015-07-23 DIAGNOSIS — L814 Other melanin hyperpigmentation: Secondary | ICD-10-CM | POA: Diagnosis not present

## 2015-07-23 DIAGNOSIS — D2262 Melanocytic nevi of left upper limb, including shoulder: Secondary | ICD-10-CM | POA: Diagnosis not present

## 2015-07-23 DIAGNOSIS — L820 Inflamed seborrheic keratosis: Secondary | ICD-10-CM | POA: Diagnosis not present

## 2015-07-23 DIAGNOSIS — D485 Neoplasm of uncertain behavior of skin: Secondary | ICD-10-CM | POA: Diagnosis not present

## 2015-07-23 DIAGNOSIS — D1801 Hemangioma of skin and subcutaneous tissue: Secondary | ICD-10-CM | POA: Diagnosis not present

## 2015-07-24 DIAGNOSIS — J300 Vasomotor rhinitis: Secondary | ICD-10-CM | POA: Diagnosis not present

## 2015-07-24 DIAGNOSIS — R635 Abnormal weight gain: Secondary | ICD-10-CM | POA: Diagnosis not present

## 2015-07-28 DIAGNOSIS — R635 Abnormal weight gain: Secondary | ICD-10-CM | POA: Insufficient documentation

## 2015-07-28 DIAGNOSIS — J300 Vasomotor rhinitis: Secondary | ICD-10-CM | POA: Insufficient documentation

## 2015-08-07 DIAGNOSIS — J300 Vasomotor rhinitis: Secondary | ICD-10-CM | POA: Diagnosis not present

## 2015-08-07 DIAGNOSIS — R635 Abnormal weight gain: Secondary | ICD-10-CM | POA: Diagnosis not present

## 2015-08-07 DIAGNOSIS — Z6823 Body mass index (BMI) 23.0-23.9, adult: Secondary | ICD-10-CM | POA: Diagnosis not present

## 2015-08-15 DIAGNOSIS — Z713 Dietary counseling and surveillance: Secondary | ICD-10-CM | POA: Diagnosis not present

## 2015-08-29 DIAGNOSIS — Z713 Dietary counseling and surveillance: Secondary | ICD-10-CM | POA: Diagnosis not present

## 2015-09-10 DIAGNOSIS — Z713 Dietary counseling and surveillance: Secondary | ICD-10-CM | POA: Diagnosis not present

## 2015-09-10 DIAGNOSIS — R635 Abnormal weight gain: Secondary | ICD-10-CM | POA: Diagnosis not present

## 2015-09-10 DIAGNOSIS — Z6822 Body mass index (BMI) 22.0-22.9, adult: Secondary | ICD-10-CM | POA: Diagnosis not present

## 2015-09-25 ENCOUNTER — Ambulatory Visit (INDEPENDENT_AMBULATORY_CARE_PROVIDER_SITE_OTHER): Payer: Medicare Other | Admitting: Podiatry

## 2015-09-25 ENCOUNTER — Encounter: Payer: Self-pay | Admitting: Podiatry

## 2015-09-25 ENCOUNTER — Ambulatory Visit (INDEPENDENT_AMBULATORY_CARE_PROVIDER_SITE_OTHER): Payer: Medicare Other

## 2015-09-25 VITALS — BP 101/61 | HR 86 | Resp 16 | Ht 68.5 in | Wt 150.0 lb

## 2015-09-25 DIAGNOSIS — M79673 Pain in unspecified foot: Secondary | ICD-10-CM

## 2015-09-25 DIAGNOSIS — M722 Plantar fascial fibromatosis: Secondary | ICD-10-CM

## 2015-09-25 MED ORDER — DICLOFENAC SODIUM 1 % TD GEL: 4.0000 g | Freq: Four times a day (QID) | TRANSDERMAL | Status: DC

## 2015-09-25 NOTE — Progress Notes (Signed)
   Subjective:    Patient ID: Veronica Snyder, female    DOB: September 23, 1945, 70 y.o.   MRN: [id number]  HPI: She presents today with chief complaint of pain and numbness to the bilateral plantar aspect of the foot. She states sometimes seems the the entire foot hurts but is hurts mostly when she is walking and she's noticed the numbness is in the forefoot but painful in the heel when she gets up to walk. She tried different shoe gear but to no avail.    Review of Systems  Constitutional: Positive for fatigue and unexpected weight change.  HENT: Positive for sinus pressure.   All other systems reviewed and are negative.      Objective:   Physical Exam: Vital signs are stable alert and oriented 3 pulses are palpable. On reproducible pain today is down the posterior and inferior heel and in the medial longitudinal arch. Orthopedic evaluation and strength arthrosis to the carpal range of motion without crepitation. 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 Soft tissue increasing density at the insertion site is present. Cutaneous evaluation does not demonstrate any type of cutaneous abnormalities no open lesions or wounds. Neurologic sensorium is intact percent once the monofilament due to and reflexes are intact.        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  Plan: We discussed possible injection therapy oral medication therapy patient declined. I also recommended a topical diclofenac gel which she did receive. I will follow up with her on an as-needed basis. We discussed appropriate shoe gear stretching exercises and ice therapy and I supplied her with stretching exercises for Achilles tendinitis and plantar fasciitis.

## 2015-09-25 NOTE — Patient Instructions (Signed)
Plantar Fasciitis With Rehab The plantar fascia is a fibrous, ligament-like, soft-tissue structure that spans the bottom of the foot. Plantar fasciitis, also called heel spur syndrome, is a condition that causes pain in the foot due to inflammation of the tissue. SYMPTOMS   Pain and tenderness on the underneath side of the foot.  Pain that worsens with standing or walking. CAUSES  Plantar fasciitis is caused by irritation and injury to the plantar fascia on the underneath side of the foot. Common mechanisms of injury include:  Direct trauma to bottom of the foot.  Damage to a small nerve that runs under the foot where the main fascia attaches to the heel bone.  Stress placed on the plantar fascia due to bone spurs. RISK INCREASES WITH:   Activities that place stress on the plantar fascia (running, jumping, pivoting, or cutting).  Poor strength and flexibility.  Improperly fitted shoes.  Tight calf muscles.  Flat feet.  Failure to warm-up properly before activity.  Obesity. PREVENTION  Warm up and stretch properly before activity.  Allow for adequate recovery between workouts.  Maintain physical fitness:  Strength, flexibility, and endurance.  Cardiovascular fitness.  Maintain a health body weight.  Avoid stress on the plantar fascia.  Wear properly fitted shoes, including arch supports for individuals who have flat feet. PROGNOSIS  If treated properly, then the symptoms of plantar fasciitis usually resolve without surgery. However, occasionally surgery is necessary. RELATED COMPLICATIONS   Recurrent symptoms that may result in a chronic condition.  Problems of the lower back that are caused by compensating for the injury, such as limping.  Pain or weakness of the foot during push-off following surgery.  Chronic inflammation, scarring, and partial or complete fascia tear, occurring more often from repeated injections. TREATMENT  Treatment initially involves  the use of ice and medication to help reduce pain and inflammation. The use of strengthening and stretching exercises may help reduce pain with activity, especially stretches of the Achilles tendon. These exercises may be performed at home or with a therapist. Your caregiver may recommend that you use heel cups of arch supports to help reduce stress on the plantar fascia. Occasionally, corticosteroid injections are given to reduce inflammation. If symptoms persist for greater than 6 months despite non-surgical (conservative), then surgery may be recommended.  MEDICATION   If pain medication is necessary, then nonsteroidal anti-inflammatory medications, such as aspirin and ibuprofen, or other minor pain relievers, such as acetaminophen, are often recommended.  Do not take pain medication within 7 days before surgery.  Prescription pain relievers may be given if deemed necessary by your caregiver. Use only as directed and only as much as you need.  Corticosteroid injections may be given by your caregiver. These injections should be reserved for the most serious cases, because they may only be given a certain number of times. HEAT AND COLD  Cold treatment (icing) relieves pain and reduces inflammation. Cold treatment should be applied for 10 to 15 minutes every 2 to 3 hours for inflammation and pain and immediately after any activity that aggravates your symptoms. Use ice packs or massage the area with a piece of ice (ice massage).  Heat treatment may be used prior to performing the stretching and strengthening activities prescribed by your caregiver, physical therapist, or athletic trainer. Use a heat pack or soak the injury in warm water. SEEK IMMEDIATE MEDICAL CARE IF:  Treatment seems to offer no benefit, or the condition worsens.  Any medications produce adverse side effects.   EXERCISES RANGE OF MOTION (ROM) AND STRETCHING EXERCISES - Plantar Fasciitis (Heel Spur Syndrome) These exercises may  help you when beginning to rehabilitate your injury. Your symptoms may resolve with or without further involvement from your physician, physical therapist or athletic trainer. While completing these exercises, remember:   Restoring tissue flexibility helps normal motion to return to the joints. This allows healthier, less painful movement and activity.  An effective stretch should be held for at least 30 seconds.  A stretch should never be painful. You should only feel a gentle lengthening or release in the stretched tissue. RANGE OF MOTION - Toe Extension, Flexion  Sit with your right / left leg crossed over your opposite knee.  Grasp your toes and gently pull them back toward the top of your foot. You should feel a stretch on the bottom of your toes and/or foot.  Hold this stretch for __________ seconds.  Now, gently pull your toes toward the bottom of your foot. You should feel a stretch on the top of your toes and or foot.  Hold this stretch for __________ seconds. Repeat __________ times. Complete this stretch __________ times per day.  RANGE OF MOTION - Ankle Dorsiflexion, Active Assisted  Remove shoes and sit on a chair that is preferably not on a carpeted surface.  Place right / left foot under knee. Extend your opposite leg for support.  Keeping your heel down, slide your right / left foot back toward the chair until you feel a stretch at your ankle or calf. If you do not feel a stretch, slide your bottom forward to the edge of the chair, while still keeping your heel down.  Hold this stretch for __________ seconds. Repeat __________ times. Complete this stretch __________ times per day.  STRETCH - Gastroc, Standing  Place hands on wall.  Extend right / left leg, keeping the front knee somewhat bent.  Slightly point your toes inward on your back foot.  Keeping your right / left heel on the floor and your knee straight, shift your weight toward the wall, not allowing your  back to arch.  You should feel a gentle stretch in the right / left calf. Hold this position for __________ seconds. Repeat __________ times. Complete this stretch __________ times per day. STRETCH - Soleus, Standing  Place hands on wall.  Extend right / left leg, keeping the other knee somewhat bent.  Slightly point your toes inward on your back foot.  Keep your right / left heel on the floor, bend your back knee, and slightly shift your weight over the back leg so that you feel a gentle stretch deep in your back calf.  Hold this position for __________ seconds. Repeat __________ times. Complete this stretch __________ times per day. STRETCH - Gastrocsoleus, Standing  Note: This exercise can place a lot of stress on your foot and ankle. Please complete this exercise only if specifically instructed by your caregiver.   Place the ball of your right / left foot on a step, keeping your other foot firmly on the same step.  Hold on to the wall or a rail for balance.  Slowly lift your other foot, allowing your body weight to press your heel down over the edge of the step.  You should feel a stretch in your right / left calf.  Hold this position for __________ seconds.  Repeat this exercise with a slight bend in your right / left knee. Repeat __________ times. Complete this stretch __________ times   per day.  STRENGTHENING EXERCISES - Plantar Fasciitis (Heel Spur Syndrome)  These exercises may help you when beginning to rehabilitate your injury. They may resolve your symptoms with or without further involvement from your physician, physical therapist or athletic trainer. While completing these exercises, remember:   Muscles can gain both the endurance and the strength needed for everyday activities through controlled exercises.  Complete these exercises as instructed by your physician, physical therapist or athletic trainer. Progress the resistance and repetitions only as  guided. STRENGTH - Towel Curls  Sit in a chair positioned on a non-carpeted surface.  Place your foot on a towel, keeping your heel on the floor.  Pull the towel toward your heel by only curling your toes. Keep your heel on the floor.  If instructed by your physician, physical therapist or athletic trainer, add ____________________ at the end of the towel. Repeat __________ times. Complete this exercise __________ times per day. STRENGTH - Ankle Inversion  Secure one end of a rubber exercise band/tubing to a fixed object (table, pole). Loop the other end around your foot just before your toes.  Place your fists between your knees. This will focus your strengthening at your ankle.  Slowly, pull your big toe up and in, making sure the band/tubing is positioned to resist the entire motion.  Hold this position for __________ seconds.  Have your muscles resist the band/tubing as it slowly pulls your foot back to the starting position. Repeat __________ times. Complete this exercises __________ times per day.    This information is not intended to replace advice given to you by your health care provider. Make sure you discuss any questions you have with your health care provider.   Document Released: 02/22/2005 Document Revised: 07/09/2014 Document Reviewed: 06/06/2008 Elsevier Interactive Patient Education 2016 [employer]. Achilles Tendinitis With Rehab Achilles tendinitis is a disorder of the Achilles tendon. The Achilles tendon connects the large calf muscles (Gastrocnemius and Soleus) to the heel bone (calcaneus). This tendon is sometimes called the heel cord. It is important for pushing-off and standing on your toes and is important for walking, running, or jumping. Tendinitis is often caused by overuse and repetitive microtrauma. SYMPTOMS  Pain, tenderness, swelling, warmth, and redness may occur over the Achilles tendon even at rest.  Pain with pushing off, or flexing or  extending the ankle.  Pain that is worsened after or during activity. CAUSES   Overuse sometimes seen with rapid increase in exercise programs or in sports requiring running and jumping.  Poor physical conditioning (strength and flexibility or endurance).  Running sports, especially training running down hills.  Inadequate warm-up before practice or play or failure to stretch before participation.  Injury to the tendon. PREVENTION   Warm up and stretch before practice or competition.  Allow time for adequate rest and recovery between practices and competition.  Keep up conditioning.  Keep up ankle and leg flexibility.  Improve or keep muscle strength and endurance.  Improve cardiovascular fitness.  Use proper technique.  Use proper equipment (shoes, skates).  To help prevent recurrence, taping, protective strapping, or an adhesive bandage may be recommended for several weeks after healing is complete. PROGNOSIS   Recovery may take weeks to several months to heal.  Longer recovery is expected if symptoms have been prolonged.  Recovery is usually quicker if the inflammation is due to a direct blow as compared with overuse or sudden strain. RELATED COMPLICATIONS   Healing time will be prolonged if the condition  is not correctly treated. The injury must be given plenty of time to heal.  Symptoms can reoccur if activity is resumed too soon.  Untreated, tendinitis may increase the risk of tendon rupture requiring additional time for recovery and possibly surgery. TREATMENT   The first treatment consists of rest anti-inflammatory medication, and ice to relieve the pain.  Stretching and strengthening exercises after resolution of pain will likely help reduce the risk of recurrence. Referral to a physical therapist or athletic trainer for further evaluation and treatment may be helpful.  A walking boot or cast may be recommended to rest the Achilles tendon. This can help  break the cycle of inflammation and microtrauma.  Arch supports (orthotics) may be prescribed or recommended by your caregiver as an adjunct to therapy and rest.  Surgery to remove the inflamed tendon lining or degenerated tendon tissue is rarely necessary and has shown less than predictable results. MEDICATION   Nonsteroidal anti-inflammatory medications, such as aspirin and ibuprofen, may be used for pain and inflammation relief. Do not take within 7 days before surgery. Take these as directed by your caregiver. Contact your caregiver immediately if any bleeding, stomach upset, or signs of allergic reaction occur. Other minor pain relievers, such as acetaminophen, may also be used.  Pain relievers may be prescribed as necessary by your caregiver. Do not take prescription pain medication for longer than 4 to 7 days. Use only as directed and only as much as you need.  Cortisone injections are rarely indicated. Cortisone injections may weaken tendons and predispose to rupture. It is better to give the condition more time to heal than to use them. HEAT AND COLD  Cold is used to relieve pain and reduce inflammation for acute and chronic Achilles tendinitis. Cold should be applied for 10 to 15 minutes every 2 to 3 hours for inflammation and pain and immediately after any activity that aggravates your symptoms. Use ice packs or an ice massage.  Heat may be used before performing stretching and strengthening activities prescribed by your caregiver. Use a heat pack or a warm soak. SEEK MEDICAL CARE IF:  Symptoms get worse or do not improve in 2 weeks despite treatment.  New, unexplained symptoms develop. Drugs used in treatment may produce side effects. EXERCISES RANGE OF MOTION (ROM) AND STRETCHING EXERCISES - Achilles Tendinitis  These exercises may help you when beginning to rehabilitate your injury. Your symptoms may resolve with or without further involvement from your physician, physical  therapist or athletic trainer. While completing these exercises, remember:   Restoring tissue flexibility helps normal motion to return to the joints. This allows healthier, less painful movement and activity.  An effective stretch should be held for at least 30 seconds.  A stretch should never be painful. You should only feel a gentle lengthening or release in the stretched tissue. STRETCH - Gastroc, Standing   Place hands on wall.  Extend right / left leg, keeping the front knee somewhat bent.  Slightly point your toes inward on your back foot.  Keeping your right / left heel on the floor and your knee straight, shift your weight toward the wall, not allowing your back to arch.  You should feel a gentle stretch in the right / left calf. Hold this position for __________ seconds. Repeat __________ times. Complete this stretch __________ times per day. STRETCH - Soleus, Standing   Place hands on wall.  Extend right / left leg, keeping the other knee somewhat bent.  Slightly point   your toes inward on your back foot.  Keep your right / left heel on the floor, bend your back knee, and slightly shift your weight over the back leg so that you feel a gentle stretch deep in your back calf.  Hold this position for __________ seconds. Repeat __________ times. Complete this stretch __________ times per day. STRETCH - Gastrocsoleus, Standing  Note: This exercise can place a lot of stress on your foot and ankle. Please complete this exercise only if specifically instructed by your caregiver.   Place the ball of your right / left foot on a step, keeping your other foot firmly on the same step.  Hold on to the wall or a rail for balance.  Slowly lift your other foot, allowing your body weight to press your heel down over the edge of the step.  You should feel a stretch in your right / left calf.  Hold this position for __________ seconds.  Repeat this exercise with a slight bend in your  knee. Repeat __________ times. Complete this stretch __________ times per day.  STRENGTHENING EXERCISES - Achilles Tendinitis These exercises may help you when beginning to rehabilitate your injury. They may resolve your symptoms with or without further involvement from your physician, physical therapist or athletic trainer. While completing these exercises, remember:   Muscles can gain both the endurance and the strength needed for everyday activities through controlled exercises.  Complete these exercises as instructed by your physician, physical therapist or athletic trainer. Progress the resistance and repetitions only as guided.  You may experience muscle soreness or fatigue, but the pain or discomfort you are trying to eliminate should never worsen during these exercises. If this pain does worsen, stop and make certain you are following the directions exactly. If the pain is still present after adjustments, discontinue the exercise until you can discuss the trouble with your clinician. STRENGTH - Plantar-flexors   Sit with your right / left leg extended. Holding onto both ends of a rubber exercise band/tubing, loop it around the ball of your foot. Keep a slight tension in the band.  Slowly push your toes away from you, pointing them downward.  Hold this position for __________ seconds. Return slowly, controlling the tension in the band/tubing. Repeat __________ times. Complete this exercise __________ times per day.  STRENGTH - Plantar-flexors   Stand with your feet shoulder width apart. Steady yourself with a wall or table using as little support as needed.  Keeping your weight evenly spread over the width of your feet, rise up on your toes.*  Hold this position for __________ seconds. Repeat __________ times. Complete this exercise __________ times per day.  *If this is too easy, shift your weight toward your right / left leg until you feel challenged. Ultimately, you may be asked  to do this exercise with your right / left foot only. STRENGTH - Plantar-flexors, Eccentric  Note: This exercise can place a lot of stress on your foot and ankle. Please complete this exercise only if specifically instructed by your caregiver.   Place the balls of your feet on a step. With your hands, use only enough support from a wall or rail to keep your balance.  Keep your knees straight and rise up on your toes.  Slowly shift your weight entirely to your right / left toes and pick up your opposite foot. Gently and with controlled movement, lower your weight through your right / left foot so that your heel drops below the   level of the step. You will feel a slight stretch in the back of your calf at the end position.  Use the healthy leg to help rise up onto the balls of both feet, then lower weight only on the right / left leg again. Build up to 15 repetitions. Then progress to 3 consecutive sets of 15 repetitions.*  After completing the above exercise, complete the same exercise with a slight knee bend (about 30 degrees). Again, build up to 15 repetitions. Then progress to 3 consecutive sets of 15 repetitions.* Perform this exercise __________ times per day.  *When you easily complete 3 sets of 15, your physician, physical therapist or athletic trainer may advise you to add resistance by wearing a backpack filled with additional weight. STRENGTH - Plantar Flexors, Seated   Sit on a chair that allows your feet to rest flat on the ground. If necessary, sit at the edge of the chair.  Keeping your toes firmly on the ground, lift your right / left heel as far as you can without increasing any discomfort in your ankle. Repeat __________ times. Complete this exercise __________ times a day. *If instructed by your physician, physical therapist or athletic trainer, you may add ____________________ of resistance by placing a weighted object on your right / left knee.   This information is not  intended to replace advice given to you by your health care provider. Make sure you discuss any questions you have with your health care provider.   Document Released: 09/23/2004 Document Revised: 03/15/2014 Document Reviewed: 06/06/2008 Else[employer] Interactive Patient Education [employer].

## 2015-09-26 ENCOUNTER — Emergency Department (HOSPITAL_COMMUNITY): Payer: Medicare Other

## 2015-09-26 ENCOUNTER — Emergency Department (HOSPITAL_COMMUNITY)
Admission: EM | Admit: 2015-09-26 | Discharge: 2015-09-26 | Disposition: A | Discharge status: Home / Self Care | Payer: Medicare Other | Attending: Emergency Medicine | Admitting: Emergency Medicine

## 2015-09-26 ENCOUNTER — Encounter (HOSPITAL_COMMUNITY): Payer: Self-pay | Admitting: *Deleted

## 2015-09-26 DIAGNOSIS — M542 Cervicalgia: Secondary | ICD-10-CM | POA: Insufficient documentation

## 2015-09-26 DIAGNOSIS — M546 Pain in thoracic spine: Secondary | ICD-10-CM | POA: Diagnosis not present

## 2015-09-26 DIAGNOSIS — Y999 Unspecified external cause status: Secondary | ICD-10-CM | POA: Insufficient documentation

## 2015-09-26 DIAGNOSIS — R200 Anesthesia of skin: Secondary | ICD-10-CM | POA: Diagnosis not present

## 2015-09-26 DIAGNOSIS — R079 Chest pain, unspecified: Secondary | ICD-10-CM | POA: Insufficient documentation

## 2015-09-26 DIAGNOSIS — Y929 Unspecified place or not applicable: Secondary | ICD-10-CM | POA: Insufficient documentation

## 2015-09-26 DIAGNOSIS — W109XXA Fall (on) (from) unspecified stairs and steps, initial encounter: Secondary | ICD-10-CM | POA: Diagnosis not present

## 2015-09-26 DIAGNOSIS — Y939 Activity, unspecified: Secondary | ICD-10-CM | POA: Diagnosis not present

## 2015-09-26 DIAGNOSIS — S0083XA Contusion of other part of head, initial encounter: Secondary | ICD-10-CM | POA: Insufficient documentation

## 2015-09-26 DIAGNOSIS — W19XXXA Unspecified fall, initial encounter: Secondary | ICD-10-CM

## 2015-09-26 DIAGNOSIS — S02401A Maxillary fracture, unspecified, initial encounter for closed fracture: Secondary | ICD-10-CM | POA: Diagnosis not present

## 2015-09-26 LAB — I-STAT CHEM 8, ED
BUN: 19 mg/dL (ref 6–20)
CALCIUM ION: 1.18 mmol/L (ref 1.12–1.23)
Chloride: 105 mmol/L (ref 101–111)
Creatinine, Ser: 0.9 mg/dL (ref 0.44–1.00)
GLUCOSE: 140 mg/dL — AB (ref 65–99)
HCT: 45 % (ref 36.0–46.0)
HEMOGLOBIN: 15.3 g/dL — AB (ref 12.0–15.0)
Potassium: 3.8 mmol/L (ref 3.5–5.1)
Sodium: 141 mmol/L (ref 135–145)
TCO2: 23 mmol/L (ref 0–100)

## 2015-09-26 MED ORDER — HYDROMORPHONE HCL 1 MG/ML IJ SOLN
0.5000 mg | Freq: Once | INTRAMUSCULAR | Status: AC
  Administered 2015-09-26: 0.5 mg via INTRAVENOUS
  Filled 2015-09-26: qty 1

## 2015-09-26 MED ORDER — ONDANSETRON HCL 4 MG/2ML IJ SOLN
4.0000 mg | Freq: Once | INTRAMUSCULAR | Status: AC
  Administered 2015-09-26: 4 mg via INTRAVENOUS
  Filled 2015-09-26: qty 2

## 2015-09-26 MED ORDER — CEPHALEXIN 500 MG PO CAPS: 500.0000 mg | ORAL_CAPSULE | Freq: Two times a day (BID) | ORAL | Status: DC

## 2015-09-26 MED ORDER — HYDROCODONE-ACETAMINOPHEN 5-325 MG PO TABS: 1.0000 | ORAL_TABLET | Freq: Four times a day (QID) | ORAL | Status: DC | PRN

## 2015-09-26 MED ORDER — TETANUS-DIPHTH-ACELL PERTUSSIS 5-2.5-18.5 LF-MCG/0.5 IM SUSP
0.5000 mL | Freq: Once | INTRAMUSCULAR | Status: DC
  Filled 2015-09-26: qty 0.5

## 2015-09-26 NOTE — ED Provider Notes (Signed)
CSN: [id number]     Arrival date & time 09/26/15  1000 History  By signing my name below, I, Rayna Sexton, attest that this documentation has been prepared under the direction and in the presence of Milton Ferguson, MD. Electronically Signed: Rayna Sexton, ED Scribe. 09/26/2015. 11:05 AM.   Chief Complaint  Patient presents with  . Fall   Patient is a 70 y.o. female presenting with fall. The history is provided by the patient. No language interpreter was used.  Fall This is a new problem. The current episode started 1 to 2 hours ago. The problem occurs rarely. The problem has not changed since onset.Pertinent negatives include no chest pain, no abdominal pain and no headaches.    HPI Comments: Veronica Snyder is a 70 y.o. female who was placed in a c-collar in triage presents to the Emergency Department due to a mechanical fall that occurred at 9:40 am. Pt states that she tripped when walking down the stairs resulting in a forward fall down 6-8 carpeted steps. She reports associated numbness to her BUE but is unsure of BLE numbness. She additionally reports posterior neck pain, upper back pain as well as swelling and bruising to her left cheek. Pt is not on blood thinning medications. She denies LOC.  Past Medical History  Diagnosis Date  . Palpitations     Short runs of SVT by event recording associated with patient's symptoms 2012   Past Surgical History  Procedure Laterality Date  . Orif distal radius fracture  2006    Right  . Irrigation and debridement sebaceous cyst  2005  . Colonoscopy  2006    Negative  . Wisdom tooth extraction     Family History  Problem Relation Age of Onset  . Alzheimer's disease Father    Social History  Substance Use Topics  . Smoking status: Unknown If Ever Smoked  . Smokeless tobacco: None  . Alcohol Use: No   OB History    No data available      Review of Systems  Constitutional: Negative for appetite change and fatigue.  HENT:  Positive for facial swelling. Negative for congestion, ear discharge and sinus pressure.   Eyes: Negative for discharge.  Respiratory: Negative for cough.   Cardiovascular: Negative for chest pain.  Gastrointestinal: Negative for abdominal pain and diarrhea.  Genitourinary: Negative for frequency and hematuria.  Musculoskeletal: Positive for myalgias, back pain, arthralgias and neck pain.  Skin: Positive for color change and wound. Negative for rash.  Neurological: Positive for numbness. Negative for seizures, syncope and headaches.  Psychiatric/Behavioral: Negative for hallucinations.  All other systems reviewed and are negative.   Allergies  Penicillins and Sulfamethoxazole  Home Medications   Prior to Admission medications   Medication Sig Start Date End Date Taking? Authorizing Provider  calcium citrate-vitamin D (CITRACAL+D) 315-200 MG-UNIT per tablet Take 2 tablets by mouth 2 (two) times daily.      Historical Provider, MD  Cholecalciferol (VITAMIN D) 2000 UNITS CAPS Take 1 capsule by mouth daily.      Historical Provider, MD  Desloratadine (CLARINEX PO) Take by mouth.    Historical Provider, MD  diclofenac sodium (VOLTAREN) 1 % GEL Apply 4 g topically 4 (four) times daily. 09/25/15   Max T Hyatt, DPM  imipramine (TOFRANIL) 10 MG tablet Take 10 mg by mouth.    Historical Provider, MD  ipratropium (ATROVENT) 0.06 % nasal spray  06/19/15   Historical Provider, MD  triamcinolone (NASACORT) 55 MCG/ACT AERO nasal  inhaler Place into the nose.    Historical Provider, MD   BP 109/56 mmHg  Pulse 70  Temp(Src) 97.6 F (36.4 C) (Oral)  Resp 16  Ht 5\' 8"  (1.727 m)  Wt 150 lb (68.04 kg)  BMI 22.81 kg/m2  SpO2 100%]   Physical Exam  Constitutional: She is oriented to person, place, and time. She appears well-developed.  HENT:  Head: Normocephalic.  Bruising noted to left face with abrasions  Eyes: Conjunctivae and EOM are normal. No scleral icterus.  Neck: Neck supple. No  thyromegaly present.  TTP to posterior neck  Cardiovascular: Normal rate and regular rhythm.  Exam reveals no gallop and no friction rub.   No murmur heard. Pulmonary/Chest: No stridor. She has no wheezes. She has no rales. She exhibits no tenderness.  Abdominal: She exhibits no distension. There is no tenderness. There is no rebound.  Musculoskeletal: She exhibits tenderness. She exhibits no edema.  TTP to upper back  Lymphadenopathy:    She has no cervical adenopathy.  Neurological: She is oriented to person, place, and time. She exhibits normal muscle tone. Coordination normal.  Skin: No rash noted. No erythema.  Psychiatric: She has a normal mood and affect. Her behavior is normal.    ED Course  Procedures  DIAGNOSTIC STUDIES: Oxygen Saturation is 100% on RA, normal by my interpretation.    COORDINATION OF CARE: 11:04 AM Discussed next steps with pt. Pt verbalized understanding and is agreeable with the plan.   Labs Review Labs Reviewed  I-STAT CHEM 8, ED - Abnormal; Notable for the following:    Glucose, Bld 140 (*)    Hemoglobin 15.3 (*)    All other components within normal limits    Imaging Review No results found. I have personally reviewed and evaluated these images and lab results as part of my medical decision-making.   EKG Interpretation None      MDM   Final diagnoses:  None   Patient had a fall CT scan of the head and cervical spine and chest unremarkable. Patient has a fracture of her left maxillary sinus. Patient will be discharged with Vicodin and Keflex and will follow-up with her ENT doctor.j The chart was scribed for me under my direct supervision.  I personally performed the history, physical, and medical decision making and all procedures in the evaluation of this patient.Milton Ferguson, MD 09/26/15 [phone]

## 2015-09-26 NOTE — ED Notes (Signed)
Pt comes in for a fall that occurred today around 0940. Pt fell down 6-8 carpeted steps. Pt states she tripped. Pt hit her left face (swelling and bruising is noted), she has neck pain, and bilateral shoulder pain. Pt denies any blood thinner use. Pt is alert and oriented. Dizziness is present.

## 2015-09-26 NOTE — ED Notes (Signed)
Pt placed in a c-collar. On and aligned.

## 2015-09-26 NOTE — Discharge Instructions (Signed)
Follow up with your ent md next week.  Follow  Up with your family md in neck problems

## 2015-10-01 ENCOUNTER — Telehealth: Payer: Self-pay | Admitting: *Deleted

## 2015-10-01 NOTE — Telephone Encounter (Signed)
Pt states [facility] states the Voltaren gel rx was not there.  I told pt I reviewed the medication orders and the order was confirmed received 09/25/2015 at 9:00am by [facility], to have the pharmacy call if not there.

## 2015-10-07 DIAGNOSIS — S0240DD Maxillary fracture, left side, subsequent encounter for fracture with routine healing: Secondary | ICD-10-CM | POA: Diagnosis not present

## 2015-10-09 DIAGNOSIS — Z6822 Body mass index (BMI) 22.0-22.9, adult: Secondary | ICD-10-CM | POA: Diagnosis not present

## 2015-10-09 DIAGNOSIS — R635 Abnormal weight gain: Secondary | ICD-10-CM | POA: Diagnosis not present

## 2015-10-14 ENCOUNTER — Encounter (HOSPITAL_COMMUNITY): Payer: Self-pay

## 2015-10-22 ENCOUNTER — Ambulatory Visit (HOSPITAL_COMMUNITY): Payer: Medicare Other | Admitting: Physical Therapy

## 2015-10-24 ENCOUNTER — Ambulatory Visit (HOSPITAL_COMMUNITY): Payer: Medicare Other | Attending: Internal Medicine

## 2015-10-24 DIAGNOSIS — M62838 Other muscle spasm: Secondary | ICD-10-CM | POA: Insufficient documentation

## 2015-10-24 DIAGNOSIS — M791 Myalgia, unspecified site: Secondary | ICD-10-CM

## 2015-10-24 DIAGNOSIS — M542 Cervicalgia: Secondary | ICD-10-CM | POA: Diagnosis not present

## 2015-10-24 NOTE — Therapy (Signed)
[place], [place], [address] Phone: [phone]   Fax:  [phone]  Physical Veronica Evaluation  Patient Details  Name: Veronica Veronica MRN: [id number] Date of Birth: [birth year] Referring Provider: Asencion Noble   Encounter Date: 10/24/2015      PT End of Session - 10/24/15 1534    Visit Number 1   Number of Visits 8   Date for PT Re-Evaluation 11/24/15   Authorization Type Medicare Trad   Authorization Time Period 10/24/15-11/24/15   Authorization - Visit Number 1   Authorization - Number of Visits 10   PT Start Time [id number]   PT Stop Time 1521   PT Time Calculation (min) 49 min   Activity Tolerance Patient tolerated treatment well   Behavior During [facility] for tasks assessed/performed      Past Medical History:  Diagnosis Date  . Palpitations    Short runs of SVT by event recording associated with patient's symptoms 2012    Past Surgical History:  Procedure Laterality Date  . COLONOSCOPY  2006   Negative  . IRRIGATION AND DEBRIDEMENT SEBACEOUS CYST  2005  . ORIF DISTAL RADIUS FRACTURE  2006   Right  . WISDOM TOOTH EXTRACTION      There were no vitals filed for this visit.       Subjective Assessment - 10/24/15 1436    Subjective Pt reports she fell down 6-8 steps while descending stairs at home on 7/21. She sustained a L maxilary fracture (intact) and has had pain in R neck/upperr trap/ shoulder since.    Pertinent History History Left adhesive capsulitits; Imaging of neck unremarkable.    Limitations Other (comment)  pain with head turns, sleeping postitions.    How long can you sit comfortably? unlimited   How long can you stand comfortably? unlimited   How long can you walk comfortably? unlimited   Currently in Pain? Yes   Pain Score 0-No pain            [facility] PT Assessment - 10/24/15 0001      Assessment   Medical Diagnosis R upper trap pain    Referring Provider Asencion Noble     Onset Date/Surgical Date 09/26/15   Hand Dominance Right   Next MD Visit none   Veronica Veronica none     Precautions   Precautions None     Restrictions   Weight Bearing Restrictions No     Balance Screen   Has the patient fallen in the past 6 months Yes   How many times? 1  related to this epsiode   Has the patient had a decrease in activity level because of a fear of falling?  No   Is the patient reluctant to leave their home because of a fear of falling?  No     Veronica Function   Level of Independence Independent     Sensation   Light Touch Appears Intact  BUE tested, = bilat     Coordination   Gross Motor Movements are Fluid and Coordinated Yes  Robots, [place], WNL, pain free     ROM / Strength   AROM / PROM / Strength Strength;AROM     AROM   AROM Assessment Site Cervical   Cervical Flexion 61   Cervical Extension 36   Cervical - Right Side Bend 29   Cervical - Left Side Bend 21   Cervical - Right Rotation 60   Cervical -  Left Rotation 40     Strength   Strength Assessment Site Shoulder;Elbow;Cervical;Forearm;Hand   Right/Left Shoulder Right;Left   Right Shoulder Flexion 4+/5  painful (less painful in scaption)   Right Shoulder Extension 5/5  (shrug; painful)   Right Shoulder ABduction 5/5  Painful   Right Shoulder Internal Rotation 4/5  Painful   Right Shoulder External Rotation 5/5  pain free   Left Shoulder Flexion 4+/5   Left Shoulder Extension 5/5  (shrug)   Left Shoulder ABduction 5/5   Left Shoulder Internal Rotation 5/5   Left Shoulder External Rotation 5/5   Right/Left Elbow Right;Left   Right Elbow Flexion 5/5   Right Elbow Extension 5/5   Left Elbow Flexion 5/5   Left Elbow Extension 5/5     Palpation   Spinal mobility Central PA mobs uncomfortable at C7, C6  Central PA mobs painful at C5-C3   Palpation comment 1* TrP: R side (UT, splenius cervicis/capitus bilat) 2* TrP R infraspinatus)  Tenderness along R supraspinatus      Balance   Balance Assessed Yes     Static Standing Balance   Static Standing - Comment/# of Minutes L SLS: 36s; R SLS: 60+                   [facility] Adult PT Treatment/Exercise - 10/24/15 0001      Exercises   Exercises Neck     Neck Exercises: Seated   Other Seated Exercise Scap retraction  15x5sH ( HEP)      Neck Exercises: Supine   Neck Retraction 15 reps;5 secs  HEP education                 PT Education - 10/24/15 1532    Education provided Yes   Education Details Explained restrictions and activity modification for comfort and pain relief.    Person(s) Educated Patient   Methods Explanation   Comprehension Verbalized understanding          PT Short Term Goals - 10/24/15 1544      PT SHORT TERM GOAL #1   Title After 4 weeks pt will demonstrate indep in advanced HEP to further progress toward mobility goals in neck.    Status New     PT SHORT TERM GOAL #2   Title After 4 weeks pt will demonstrate >65degrees cervical rotation ROM bilat, without exacerbation of pain.    Status New     PT SHORT TERM GOAL #3   Title After 4 weeks pt will demonstrate painfree 5/5 MMT in RUE to improve ability to return to working out with Physiological scientist.    Status New     PT SHORT TERM GOAL #4   Title After 4 weeks pt will report ability to tolerate sleeping at night and headturns while driving without exacerbation of neck pain.    Status New                  Plan - 10/24/15 1536    Clinical Impression Statement Veronica Veronica is a 70yo white female who presents to [facility] ~4 weeks after sustaining a fall down stairs at home. She reports continued, intermittent pain in R neck/shoulder with isolated RUE movements and headturns. The patient demonstrates impaired soft tissue integrity, impaired cervical spine mobility, impaired balance, impaired neck ROM with pain, weakness/pain in R shoulder flexion, abduction, and internal rotation.    Rehab  Potential Excellent   PT Frequency 2x / week  PT Duration 4 weeks   PT Treatment/Interventions Electrical Stimulation;Moist Heat;Functional mobility training;Therapeutic activities;Therapeutic exercise;Balance training;Patient/family education;Passive range of motion;Dry needling   PT Next Visit Plan Review HEP; goals. STM/MFR to R upper trap, R infraspinatus; supine shoulder flexion 0-45 degrees; supine GHJ IR at 90/90.   PT Home Exercise Plan At visit 2, add-in: supine shoulder flexion 0-45 degrees; supine GHJ IR at 90/90; Upper Trap Stretch.    Consulted and Agree with Plan of Care Patient      Patient will benefit from skilled therapeutic intervention in order to improve the following deficits and impairments:  Decreased activity tolerance, Decreased balance, Decreased range of motion, Increased fascial restricitons, Impaired tone, Pain, Hypomobility, Impaired flexibility, Decreased strength, Increased muscle spasms  Visit Diagnosis: Cervicalgia - Plan: PT plan of care cert/re-cert  Other muscle spasm - Plan: PT plan of care cert/re-cert  Myalgia - Plan: PT plan of care cert/re-cert      G-Codes - 0000000 1549    Functional Assessment Tool Used Clinical Judgment    Functional Limitation Changing and maintaining body position   Changing and Maintaining Body Position Current Status [id number]) At least 1 percent but less than 20 percent impaired, limited or restricted   Changing and Maintaining Body Position Goal Status [id number]) 0 percent impaired, limited or restricted       Problem List Patient Active Problem List   Diagnosis Date Noted  . Abnormal weight gain 07/28/2015  . 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 Pain in joint, shoulder region 05/03/2012  . Impingement syndrome of left shoulder 05/03/2012  . 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 PALPITATIONS 03/13/2010    3:53 PM, 10/24/15 Etta Grandchild, PT, DPT Physical Therapist at [facility] [phone] (office)     [facility] [address] [place], [place], [address] Phone: [phone]   Fax:  [phone]  Name: Veronica Veronica MRN: [id number] Date of Birth: 1946/01/11

## 2015-11-04 ENCOUNTER — Ambulatory Visit (HOSPITAL_COMMUNITY): Payer: Medicare Other | Admitting: Physical Therapy

## 2015-11-04 DIAGNOSIS — M62838 Other muscle spasm: Secondary | ICD-10-CM | POA: Diagnosis not present

## 2015-11-04 DIAGNOSIS — M542 Cervicalgia: Secondary | ICD-10-CM | POA: Diagnosis not present

## 2015-11-04 DIAGNOSIS — M791 Myalgia, unspecified site: Secondary | ICD-10-CM

## 2015-11-04 NOTE — Patient Instructions (Signed)
Flexibility: Upper Trapezius Stretch    Gently grasp right side of head while reaching behind back with other hand. Tilt head away until a gentle stretch is felt. Hold 30 seconds. Repeat  2 times per set. 2x/day  http://orth.exer.us/341   Copyright  VHI. All rights reserved.

## 2015-11-04 NOTE — Therapy (Signed)
[place], [place], [address] Phone: [phone]   Fax:  [phone]  Physical Therapy Treatment  Patient Details  Name: Veronica [place] MRN: [id number] Date of Birth: Aug 18, 1945 Referring Provider: Asencion Noble   Encounter Date: 11/04/2015      PT End of Session - 11/04/15 1614    Visit Number 2   Number of Visits 8   Date for PT Re-Evaluation 11/24/15   Authorization Type Medicare Trad   Authorization Time Period 10/24/15-11/24/15   Authorization - Visit Number 2   Authorization - Number of Visits 10   PT Start Time 1346   PT Stop Time 1430   PT Time Calculation (min) 44 min   Activity Tolerance Patient tolerated treatment well   Behavior During Therapy [facility] for tasks assessed/performed      Past Medical History:  Diagnosis Date  . Palpitations    Short runs of SVT by event recording associated with patient's symptoms 2012    Past Surgical History:  Procedure Laterality Date  . COLONOSCOPY  2006   Negative  . IRRIGATION AND DEBRIDEMENT SEBACEOUS CYST  2005  . ORIF DISTAL RADIUS FRACTURE  2006   Right  . WISDOM TOOTH EXTRACTION      There were no vitals filed for this visit.      Subjective Assessment - 11/04/15 1350    Subjective Pt reports that she is doing much better since her inital eval. She has been doing her HEP 1x/day. Notices stiffness in her upper traps mostly in the morning which takes about 15-20 minutes to alleviate.    Pertinent History History Left adhesive capsulitits; Imaging of neck unremarkable.    Limitations Other (comment)  pain with head turns, sleeping postitions.    How long can you sit comfortably? unlimited   How long can you stand comfortably? unlimited   How long can you walk comfortably? unlimited   Currently in Pain? No/denies                         [facility] Adult PT Treatment/Exercise - 11/04/15 0001      Neck Exercises: Seated   Neck  Retraction 15 reps;3 secs   Neck Retraction Limitations verbal cues for technique    Other Seated Exercise x15 reps, 5 sec hold   verbal/tactile cues to decrease upper trap activity     Neck Exercises: Supine   Neck Retraction 15 reps;5 secs   Neck Retraction Limitations verbal cues to correct technique     Manual Therapy   Manual Therapy Soft tissue mobilization;Muscle Energy Technique   Manual therapy comments separate rest of session   Soft tissue mobilization STM Rt upper trap, rhomboids/levator scap, Rt infraspinatus   Muscle Energy Technique B 1st rib MET      Neck Exercises: Stretches   Upper Trapezius Stretch 2 reps;30 seconds   Other Neck Stretches cervical rotation Lt 3x30 sec                 PT Education - 11/04/15 1615    Education provided Yes   Education Details technique with HEP; importance of proper technique with therex   Person(s) Educated Patient   Methods Explanation;Demonstration;Handout   Comprehension Verbalized understanding;Returned demonstration          PT Short Term Goals - 10/24/15 1544      PT SHORT TERM GOAL #1   Title After 4 weeks pt will demonstrate  indep in advanced HEP to further progress toward mobility goals in neck.    Status New     PT SHORT TERM GOAL #2   Title After 4 weeks pt will demonstrate >65degrees cervical rotation ROM bilat, without exacerbation of pain.    Status New     PT SHORT TERM GOAL #3   Title After 4 weeks pt will demonstrate painfree 5/5 MMT in RUE to improve ability to return to working out with Physiological scientist.    Status New     PT SHORT TERM GOAL #4   Title After 4 weeks pt will report ability to tolerate sleeping at night and headturns while driving without exacerbation of neck pain.    Status New                  Plan - 11/04/15 1611    Clinical Impression Statement Pt has made progress towards her goals since her initial evaluation with reported improvement in pain and  consistency with her HEP. Reviewed technique with HEP and made some adjustments with minimal verbal cues. Pt continues to present with tenderness and muscle spasm along her upper trap/periscap region, so session was ended with STM and trigger point release. Pt reporting improvement in overall mobility by the end of the session.   Rehab Potential Excellent   PT Frequency 2x / week   PT Duration 4 weeks   PT Treatment/Interventions Electrical Stimulation;Moist Heat;Functional mobility training;Therapeutic activities;Therapeutic exercise;Balance training;Patient/family education;Passive range of motion;Dry needling   PT Next Visit Plan STM/MFR to R upper trap, periscap region; progression of HEP to seated if able to demonstrate proper technique; supine shoulder flexion 0-45 degrees; supine GHJ IR at 90/90.   PT Home Exercise Plan Upper Trap Stretch, supine chin tuck, supine rotation stretch, scap retraction    Consulted and Agree with Plan of Care Patient      Patient will benefit from skilled therapeutic intervention in order to improve the following deficits and impairments:  Decreased activity tolerance, Decreased balance, Decreased range of motion, Increased fascial restricitons, Impaired tone, Pain, Hypomobility, Impaired flexibility, Decreased strength, Increased muscle spasms  Visit Diagnosis: Cervicalgia  Other muscle spasm  Myalgia     Problem List Patient Active Problem List   Diagnosis Date Noted  . Abnormal weight gain 07/28/2015  . 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 Pain in joint, shoulder region 05/03/2012  . Impingement syndrome of left shoulder 05/03/2012  . 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 PALPITATIONS 03/13/2010    4:18 PM,11/04/15 Elly Modena PT, DPT [facility] [address] [place], [place], [address] Phone: [phone]   Fax:  [phone]  Name:  Veronica [place] MRN: [id number] Date of Birth: 24-Sep-1945

## 2015-11-06 ENCOUNTER — Ambulatory Visit (HOSPITAL_COMMUNITY): Payer: Medicare Other

## 2015-11-06 DIAGNOSIS — M62838 Other muscle spasm: Secondary | ICD-10-CM

## 2015-11-06 DIAGNOSIS — M542 Cervicalgia: Secondary | ICD-10-CM

## 2015-11-06 DIAGNOSIS — M791 Myalgia, unspecified site: Secondary | ICD-10-CM

## 2015-11-06 NOTE — Therapy (Signed)
[place], [place], [address] Phone: [phone]   Fax:  [phone]  Physical Therapy Treatment  Patient Details  Name: Veronica Snyder MRN: [id number] Date of Birth: 1945/09/06 Referring Provider: Asencion Noble   Encounter Date: 11/06/2015      PT End of Session - 11/06/15 1307    Visit Number 3   Number of Visits 8   Date for PT Re-Evaluation 11/24/15   Authorization Type Medicare Trad   Authorization Time Period 10/24/15-11/24/15   Authorization - Visit Number 3   Authorization - Number of Visits 10   PT Start Time 1302   PT Stop Time 1343   PT Time Calculation (min) 41 min   Activity Tolerance Patient tolerated treatment well   Behavior During Therapy [facility] for tasks assessed/performed      Past Medical History:  Diagnosis Date  . Palpitations    Short runs of SVT by event recording associated with patient's symptoms 2012    Past Surgical History:  Procedure Laterality Date  . COLONOSCOPY  2006   Negative  . IRRIGATION AND DEBRIDEMENT SEBACEOUS CYST  2005  . ORIF DISTAL RADIUS FRACTURE  2006   Right  . WISDOM TOOTH EXTRACTION      There were no vitals filed for this visit.      Subjective Assessment - 11/06/15 1259    Subjective Pt reports she is doing much better today, feels improvements with form of stretches following last session.  No reports of pain today.     Pertinent History History Left adhesive capsulitits; Imaging of neck unremarkable.    Currently in Pain? No/denies                         [facility] Adult PT Treatment/Exercise - 11/06/15 0001      Neck Exercises: Seated   Neck Retraction 15 reps;3 secs   Neck Retraction Limitations initial verbal cues for form and technique with ability to demonstrate following cueing   Cervical Rotation Both;10 reps   Cervical Rotation Limitations 3D cervical excursion   X to V 10 reps   W Back Weights (lbs) 10 reps   Other  Seated Exercise Scapular retraction 15x 5 sec hold     Neck Exercises: Supine   Upper Extremity Flexion with Stabilization Flexion;15 reps   Other Supine Exercise IR with GHJ at 90/90; 15x     Manual Therapy   Manual Therapy Soft tissue mobilization   Manual therapy comments separate rest of session   Soft tissue mobilization STM Rt upper trap, rhomboids/levator scap, Rt infraspinatus     Neck Exercises: Stretches   Upper Trapezius Stretch 3 reps;30 seconds   Other Neck Stretches cervical rotation Lt 3x30 sec                   PT Short Term Goals - 10/24/15 1544      PT SHORT TERM GOAL #1   Title After 4 weeks pt will demonstrate indep in advanced HEP to further progress toward mobility goals in neck.    Status New     PT SHORT TERM GOAL #2   Title After 4 weeks pt will demonstrate >65degrees cervical rotation ROM bilat, without exacerbation of pain.    Status New     PT SHORT TERM GOAL #3   Title After 4 weeks pt will demonstrate painfree 5/5 MMT in RUE to improve ability to return to  working out with Physiological scientist.    Status New     PT SHORT TERM GOAL #4   Title After 4 weeks pt will report ability to tolerate sleeping at night and headturns while driving without exacerbation of neck pain.    Status New                  Plan - 11/06/15 1350    Clinical Impression Statement Pt progressing well toward her goals with reports of no cervical pain and consistency with HEP.  Reviewed form wtih current HEP, pt able to demonstrate appropraite form and technique with seated cervical retraction following inivial verbal and tactile cueing.  Pt given advanced HEP with seated cervical retraction and rotation exercises.  Ended session with soft tissue mobilization and trigger point release to upper traps and periscapular region with noted improved cervical rotation following.     Rehab Potential Excellent   PT Frequency 2x / week   PT Duration 4 weeks   PT  Treatment/Interventions Electrical Stimulation;Moist Heat;Functional mobility training;Therapeutic activities;Therapeutic exercise;Balance training;Patient/family education;Passive range of motion;Dry needling   PT Next Visit Plan Continue with cervical ROM and posture based therex.  STM/MFR to upper trap/periscap region.  F/U with seated HEP technique and form.     PT Home Exercise Plan 11/06/2015:  Progress to seated cervical retraciotn and seated cervical rotation.  Initial HEP:  Upper Trap Stretch, supine chin tuck, supine rotation stretch, scap retraction       Patient will benefit from skilled therapeutic intervention in order to improve the following deficits and impairments:  Decreased activity tolerance, Decreased balance, Decreased range of motion, Increased fascial restricitons, Impaired tone, Pain, Hypomobility, Impaired flexibility, Decreased strength, Increased muscle spasms  Visit Diagnosis: Cervicalgia  Other muscle spasm  Myalgia     Problem List Patient Active Problem List   Diagnosis Date Noted  . Abnormal weight gain 07/28/2015  . 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 Pain in joint, shoulder region 05/03/2012  . Impingement syndrome of left shoulder 05/03/2012  . 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 PALPITATIONS 03/13/2010   Veronica Snyder, [place]; [place]  Aldona Lento 11/06/2015, 2:01 PM  [place] [address] [place], [place], [address] Phone: [phone]   Fax:  [phone]  Name: Veronica Snyder MRN: [id number] Date of Birth: 12-02-1945

## 2015-11-06 NOTE — Patient Instructions (Signed)
Flexibility: Neck Retraction    Pull head straight back, keeping eyes and jaw level. Repeat 15 times per set. Do 1-2 sets per day.  http://orth.exer.us/345   Copyright  VHI. All rights reserved.

## 2015-11-11 ENCOUNTER — Ambulatory Visit (HOSPITAL_COMMUNITY): Payer: Medicare Other | Attending: Internal Medicine

## 2015-11-11 DIAGNOSIS — M791 Myalgia, unspecified site: Secondary | ICD-10-CM

## 2015-11-11 DIAGNOSIS — M542 Cervicalgia: Secondary | ICD-10-CM | POA: Diagnosis not present

## 2015-11-11 DIAGNOSIS — R202 Paresthesia of skin: Secondary | ICD-10-CM | POA: Diagnosis not present

## 2015-11-11 DIAGNOSIS — H02401 Unspecified ptosis of right eyelid: Secondary | ICD-10-CM | POA: Diagnosis not present

## 2015-11-11 DIAGNOSIS — G44219 Episodic tension-type headache, not intractable: Secondary | ICD-10-CM | POA: Diagnosis not present

## 2015-11-11 DIAGNOSIS — R51 Headache: Secondary | ICD-10-CM | POA: Diagnosis not present

## 2015-11-11 DIAGNOSIS — M62838 Other muscle spasm: Secondary | ICD-10-CM | POA: Diagnosis not present

## 2015-11-11 NOTE — Therapy (Signed)
[place], [place], [address] Phone: [phone]   Fax:  [phone]  Physical Therapy Treatment  Patient Details  Name: DEZI TUBERVILLE MRN: [id number] Date of Birth: 08/18/1945 Referring Provider: Asencion Noble  Encounter Date: 11/11/2015      PT End of Session - 11/11/15 1635    Visit Number 4   Number of Visits 8   Date for PT Re-Evaluation 11/24/15   Authorization Type Medicare Trad   Authorization Time Period 10/24/15-11/24/15   Authorization - Visit Number 4   Authorization - Number of Visits 10   PT Start Time 1430   PT Stop Time [id number]   PT Time Calculation (min) 44 min   Activity Tolerance Patient tolerated treatment well   Behavior During Therapy [facility] for tasks assessed/performed      Past Medical History:  Diagnosis Date  . Palpitations    Short runs of SVT by event recording associated with patient's symptoms 2012    Past Surgical History:  Procedure Laterality Date  . COLONOSCOPY  2006   Negative  . IRRIGATION AND DEBRIDEMENT SEBACEOUS CYST  2005  . ORIF DISTAL RADIUS FRACTURE  2006   Right  . WISDOM TOOTH EXTRACTION      There were no vitals filed for this visit.      Subjective Assessment - 11/11/15 1435    Subjective Pt expressed she feels much better, and it's hardly bothering her at all.  Pt feels like she is getting full cervical range, no more twinges of pain anymore.     Pertinent History History Left adhesive capsulitits; Imaging of neck unremarkable.    Limitations Other (comment)  pain with head turns and sleeping position   How long can you sit comfortably? unlimited   How long can you stand comfortably? unlimited   How long can you walk comfortably? unlimited   Currently in Pain? No/denies            [facility] PT Assessment - 11/11/15 0001      Assessment   Medical Diagnosis R upper trap pain    Referring Provider Asencion Noble   Onset Date/Surgical Date 09/26/15   Hand Dominance Right   Next MD Visit none   Prior Therapy none     AROM   Cervical - Right Rotation 60   Cervical - Left Rotation 55                     [facility] Adult PT Treatment/Exercise - 11/11/15 0001      Bed Mobility   Bed Mobility --  Discussed sleeping posture - pt sleeps mostly on L side.  Recommended adding a pillow to hug to avoid forward rounded shoulder position when sleeping.        Neck Exercises: Seated   X to V 10 reps   X to V Weights (lbs) 2# dumbells   Shoulder Flexion Both;10 reps  with dowel rod and vc's for scap retraction    Shoulder ABduction Both;10 reps  Standing   Shoulder Abduction Weights (lbs) 2# dumbells     Neck Exercises: Supine   Other Supine Exercise scapular retraction x 15 reps supine with cues on no shld shrug.      Manual Therapy   Manual Therapy Soft tissue mobilization   Manual therapy comments completely separate from all other interventions   Soft tissue mobilization STM B with focus on R upper trap, levator scap. TrP release to  R Upper trap     Neck Exercises: Stretches   Upper Trapezius Stretch 3 reps;30 seconds  Review of technique - Encouraged to use mirror at home   Chest Stretch 2 reps;30 seconds  Doorway - pec stretch                PT Education - 11/11/15 1634    Education provided Yes   Education Details Review UT stretch with correction of technique and encouraged to use mirror.  Progressed UE exercises and encouraged to use scapular stabilizers during shoulder movement.    Person(s) Educated Patient   Methods Explanation;Demonstration;Handout   Comprehension Verbalized understanding;Returned demonstration          PT Short Term Goals - 11/11/15 1644      PT SHORT TERM GOAL #1   Title After 4 weeks pt will demonstrate indep in advanced HEP to further progress toward mobility goals in neck.    Status New     PT SHORT TERM GOAL #2   Title After 4 weeks pt will demonstrate >65degrees  cervical rotation ROM bilat, without exacerbation of pain.    Status New     PT SHORT TERM GOAL #3   Title After 4 weeks pt will demonstrate painfree 5/5 MMT in RUE to improve ability to return to working out with Physiological scientist.    Status New     PT SHORT TERM GOAL #4   Title After 4 weeks pt will report ability to tolerate sleeping at night and headturns while driving without exacerbation of neck pain.    Status New                  Plan - 11/11/15 1636    Clinical Impression Statement Pt continues to progress very well, and only reports some stiffness when she first awakens in the morning.  Educated pt on proper sleeping posture- she spends most of her time on her L side, therefore encouraged pt to use a pillow to rest her R UE and avoid the forward rounded position.   Pt expresses feeling pleased with her progression.  HEP was advanced this tx session.  Trigger point in R UT continues to decrease, however not fully dissipated yet.   Pt will likely only need 1-2 more tx sessions as she is advancing nicely towards all her goals.    Rehab Potential Excellent   PT Frequency 2x / week   PT Duration 4 weeks   PT Treatment/Interventions Electrical Stimulation;Moist Heat;Functional mobility training;Therapeutic activities;Therapeutic exercise;Balance training;Patient/family education;Passive range of motion;Dry needling   PT Next Visit Plan Re-assessment - consider d/c as pt is moving nicely towards goals.  Advanced HEP, and review technique and form with B D2 UE exercises, and B shoulder abduction.     PT Home Exercise Plan 9/5 added B D2 UE exercises, and B shoulder abduction; 11/06/2015:  Progress to seated cervical retraciotn and seated cervical rotation.  Initial HEP:  Upper Trap Stretch, supine chin tuck, supine rotation stretch, scap retraction    Consulted and Agree with Plan of Care Patient      Patient will benefit from skilled therapeutic intervention in order to improve  the following deficits and impairments:     Visit Diagnosis: Cervicalgia  Other muscle spasm  Myalgia     Problem List Patient Active Problem List   Diagnosis Date Noted  . Abnormal weight gain 07/28/2015  . 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 Pain in joint, shoulder region 05/03/2012  .  Impingement syndrome of left shoulder 05/03/2012  . 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 PALPITATIONS 03/13/2010    Beth Orey Moure, PT, DPT X: [phone]   [place] [address] [place], [place], [address] Phone: [phone]   Fax:  [phone]  Name: NETA SCHAMEL MRN: [id number] Date of Birth: 28-Aug-1945

## 2015-11-13 ENCOUNTER — Ambulatory Visit (HOSPITAL_COMMUNITY): Payer: Medicare Other

## 2015-11-13 DIAGNOSIS — M542 Cervicalgia: Secondary | ICD-10-CM | POA: Diagnosis not present

## 2015-11-13 DIAGNOSIS — M62838 Other muscle spasm: Secondary | ICD-10-CM

## 2015-11-13 DIAGNOSIS — M791 Myalgia, unspecified site: Secondary | ICD-10-CM

## 2015-11-13 NOTE — Therapy (Signed)
[place], [place], [address] Phone: [phone]   Fax:  [phone]  Physical Therapy Treatment  Patient Details  Name: Veronica Snyder MRN: [id number] Date of Birth: 05-Feb-1946 Referring Provider: Asencion Noble  Encounter Date: 11/13/2015      PT End of Session - 11/13/15 1311    Visit Number 5   Number of Visits 8   Date for PT Re-Evaluation 11/24/15   Authorization Type Medicare Trad   Authorization Time Period 10/24/15-11/24/15   Authorization - Visit Number 5   Authorization - Number of Visits 10   PT Start Time [id number]   PT Stop Time 1348   PT Time Calculation (min) 44 min   Activity Tolerance Patient tolerated treatment well   Behavior During Therapy [facility] for tasks assessed/performed      Past Medical History:  Diagnosis Date  . Palpitations    Short runs of SVT by event recording associated with patient's symptoms 2012    Past Surgical History:  Procedure Laterality Date  . COLONOSCOPY  2006   Negative  . IRRIGATION AND DEBRIDEMENT SEBACEOUS CYST  2005  . ORIF DISTAL RADIUS FRACTURE  2006   Right  . WISDOM TOOTH EXTRACTION      There were no vitals filed for this visit.      Subjective Assessment - 11/13/15 1302    Subjective Pt stated she is feeling good today, no reports of pain today.  Pt feels like she is improng with cervical range though does feel a little pull with end range rotation.   Pertinent History History Left adhesive capsulitits; Imaging of neck unremarkable.    Currently in Pain? No/denies                         [facility] Adult PT Treatment/Exercise - 11/13/15 0001      Neck Exercises: Theraband   Shoulder Extension 10 reps;Red   Other Theraband Exercises ABD 10 RTB   Other Theraband Exercises flexion 10x RTB     Neck Exercises: Seated   Cervical Rotation Both;5 reps  AAROM with towel around neck 5x 10"   Cervical Rotation Limitations AAROM with towel  around neck 5x 10"   X to V 10 reps   X to V Weights (lbs) 2# dumbells   Shoulder Flexion Both;15 reps;Weights  2   Shoulder Flexion Weights (lbs) 2# dumbbell   Shoulder ABduction Both;15 reps;Limitations   Shoulder Abduction Weights (lbs) 2# dumbells     Manual Therapy   Manual Therapy Soft tissue mobilization   Manual therapy comments completely separate from all other interventions   Soft tissue mobilization STM B with focus on R upper trap, levator scap. TrP release to R Upper trap     Neck Exercises: Stretches   Upper Trapezius Stretch 3 reps;30 seconds                  PT Short Term Goals - 11/11/15 1644      PT SHORT TERM GOAL #1   Title After 4 weeks pt will demonstrate indep in advanced HEP to further progress toward mobility goals in neck.    Status New     PT SHORT TERM GOAL #2   Title After 4 weeks pt will demonstrate >65degrees cervical rotation ROM bilat, without exacerbation of pain.    Status New     PT SHORT TERM GOAL #3   Title After 4 weeks  pt will demonstrate painfree 5/5 MMT in RUE to improve ability to return to working out with Physiological scientist.    Status New     PT SHORT TERM GOAL #4   Title After 4 weeks pt will report ability to tolerate sleeping at night and headturns while driving without exacerbation of neck pain.    Status New                  Plan - 11/13/15 1415    Clinical Impression Statement Pt progressing well towards goals with reports of no pain, compliance with advanced HEP as well as improved cervical mobility.  Session focus on manual and therex to improve cervical rotation and shoulder strengthening exercises.  Pt instructed AAROM with towel to assist with rotation.  Added theraband shoulder strengthening exercises with good form noted and minimal cueing required for technqiue.  Ended session with soft tissue mobilization to reduce overall tightness and resolve trigger points in Rt UE.  No reports of pain through  session.   Rehab Potential Excellent   PT Frequency 2x / week   PT Duration 4 weeks   PT Treatment/Interventions Electrical Stimulation;Moist Heat;Functional mobility training;Therapeutic activities;Therapeutic exercise;Balance training;Patient/family education;Passive range of motion;Dry needling   PT Next Visit Plan Reassess next session for probable discharge.     PT Home Exercise Plan 09/07 RTB for shoulder strengthening exercises; 9/5 added B D2 UE exercises, and B shoulder abduction; 11/06/2015:  Progress to seated cervical retraciotn and seated cervical rotation.  Initial HEP:  Upper Trap Stretch, supine chin tuck, supine rotation stretch, scap retraction       Patient will benefit from skilled therapeutic intervention in order to improve the following deficits and impairments:  Decreased activity tolerance, Decreased balance, Decreased range of motion, Increased fascial restricitons, Impaired tone, Pain, Hypomobility, Impaired flexibility, Decreased strength, Increased muscle spasms  Visit Diagnosis: Cervicalgia  Other muscle spasm  Myalgia     Problem List Patient Active Problem List   Diagnosis Date Noted  . Abnormal weight gain 07/28/2015  . 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 Pain in joint, shoulder region 05/03/2012  . Impingement syndrome of left shoulder 05/03/2012  . 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 PALPITATIONS 03/13/2010   Ihor [place], [place]; [place]  Aldona Lento 11/13/2015, 2:20 PM  [place] [address] [place], [place], [address] Phone: [phone]   Fax:  [phone]  Name: Veronica Snyder MRN: [id number] Date of Birth: 21-Feb-1946

## 2015-11-13 NOTE — Patient Instructions (Signed)
(  Clinic) Extension / Flexion (Assist)    Face pulley, right arm as far forward and up as is pain free. Pull arm down toward side. Repeat 10 times per set. Do 1-2 sets per session.   Copyright  VHI. All rights reserved.    With theraband in doorway close to ground stand with back towards door and lift arm infront of you, keeping elbows straight and standing tall  Standing to the side of doorway with theraband in arm opposite of door, raise arm up to the side standing tall.

## 2015-11-14 DIAGNOSIS — Z713 Dietary counseling and surveillance: Secondary | ICD-10-CM | POA: Diagnosis not present

## 2015-11-18 ENCOUNTER — Ambulatory Visit (HOSPITAL_COMMUNITY): Payer: Medicare Other

## 2015-11-18 DIAGNOSIS — M542 Cervicalgia: Secondary | ICD-10-CM

## 2015-11-18 DIAGNOSIS — M791 Myalgia, unspecified site: Secondary | ICD-10-CM

## 2015-11-18 DIAGNOSIS — M62838 Other muscle spasm: Secondary | ICD-10-CM

## 2015-11-18 NOTE — Therapy (Signed)
[facility] [place], [place], [address] Phone: [phone]   Fax:  [phone]  Physical Therapy Treatment  Patient Details  Name: Veronica Snyder MRN: [id number] Date of Birth: 07/15/1945 Referring Provider: Asencion Noble   Encounter Date: 11/18/2015      PT End of Session - 11/18/15 1559    Visit Number 6   Number of Visits 8   Date for PT Re-Evaluation 11/24/15   Authorization Type Medicare Trad   Authorization Time Period 10/24/15-11/24/15   Authorization - Visit Number 6   Authorization - Number of Visits 10   PT Start Time 5462   PT Stop Time 1556   PT Time Calculation (min) 37 min   Activity Tolerance Patient tolerated treatment well   Behavior During Therapy [facility] for tasks assessed/performed      Past Medical History:  Diagnosis Date  . Palpitations    Short runs of SVT by event recording associated with patient's symptoms 2012    Past Surgical History:  Procedure Laterality Date  . COLONOSCOPY  2006   Negative  . IRRIGATION AND DEBRIDEMENT SEBACEOUS CYST  2005  . ORIF DISTAL RADIUS FRACTURE  2006   Right  . WISDOM TOOTH EXTRACTION      There were no vitals filed for this visit.      Subjective Assessment - 11/18/15 1522    Subjective Pt reports that overall since her evaluation, things are much better. Pt reports she has not had any symptoms for about a week, and even then it was minimally involved. Pt reports that her HEP has been conssitent and routine and has assisted with some resolution of her problems.    Pertinent History History Left adhesive capsulitits; Imaging of neck unremarkable.    Limitations Other (comment)  At eval, pt had pain with headturns adn sleeping; now no pain with headturns, and only occasioanly morning stiffess after sleep.    How long can you sit comfortably? unlimited (no change since eval)    How long can you stand comfortably? unlimited (no change since eval)    How  long can you walk comfortably? unlimited (no change since eval)    Currently in Pain? No/denies            [facility] PT Assessment - 11/18/15 0001      Assessment   Medical Diagnosis R upper trap pain    Referring Provider Asencion Noble    Onset Date/Surgical Date 09/26/15   Hand Dominance Right   Next MD Visit none   Prior Therapy none     Precautions   Precautions None     Restrictions   Weight Bearing Restrictions No     Balance Screen   Has the patient fallen in the past 6 months Yes   How many times? 1  Related to MOI. None since   Has the patient had a decrease in activity level because of a fear of falling?  No   Is the patient reluctant to leave their home because of a fear of falling?  No     Sensation   Light Touch Appears Intact  BUE tested, = bilat     Coordination   Gross Motor Movements are Fluid and Coordinated Yes  Robots, [place], WNL, pain free     AROM   AROM Assessment Site Cervical   Cervical Flexion 61  (same at evaluation)    Cervical Extension 47  (36* at evaluation)  Cervical - Right Side Bend 35  (29* at evaluation)    Cervical - Left Side Bend 20  (same at evaluation)    Cervical - Right Rotation 66  (60* at evaluation)    Cervical - Left Rotation 71  (40* at evaluation)      Strength   Right Shoulder Flexion 5/5  (4+/5 at eval with pain)   Right Shoulder Extension 5/5  (Shrug pain free; was painful at eval)   Left Shoulder Flexion 5/5  (4+/5 at eval with pain)   Left Shoulder Extension 5/5  (4+/5 at eval with pain)     Static Standing Balance   Static Standing - Comment/# of Minutes L:60+s; R:60+s (Pt cut off at 60s)  (L SLS: 36s; R SLS: 60+)              PT Education - Dec 15, 2015 1558    Education provided Yes   Education Details Explained how to progress exercises going forward to continue progress in shoulder strength and cervical range.    Person(s) Educated Patient   Methods Explanation   Comprehension  Verbalized understanding;Returned demonstration          PT Short Term Goals - 12/15/15 1546      PT SHORT TERM GOAL #1   Title After 4 weeks pt will demonstrate indep in advanced HEP to further progress toward mobility goals in neck.    Status Achieved     PT SHORT TERM GOAL #2   Title After 4 weeks pt will demonstrate >65degrees cervical rotation ROM bilat, without exacerbation of pain.    Status Achieved     PT SHORT TERM GOAL #3   Title After 4 weeks pt will demonstrate painfree 5/5 MMT in RUE to improve ability to return to working out with Physiological scientist.    Status Achieved     PT SHORT TERM GOAL #4   Title After 4 weeks pt will report ability to tolerate sleeping at night and headturns while driving without exacerbation of neck pain.    Status Achieved                  Plan - 15-Dec-2015 1559    Clinical Impression Statement Reassessment performed today, with noted imrpovements in cervical spine ROM, pain free functional movement, shoulder strength, and HEP indepdendence. Pt has returned to working out with Physiological scientist and no longer has pain with head turns or sleeping at night. SLS balance is shown to have increased to 60+s bil. The patient has met all goals and is now ready for DC from therapy. Time is taken to allow the patient to ask questions and all are addressed to the satisfaction of the patient.    Rehab Potential Excellent   PT Frequency 2x / week   PT Duration 4 weeks   PT Treatment/Interventions Electrical Stimulation;Moist Heat;Functional mobility training;Therapeutic activities;Therapeutic exercise;Balance training;Patient/family education;Passive range of motion;Dry needling   Recommended Other Services Return to personal training.    Consulted and Agree with Plan of Care Patient      Patient will benefit from skilled therapeutic intervention in order to improve the following deficits and impairments:  Decreased activity tolerance, Decreased  balance, Decreased range of motion, Increased fascial restricitons, Impaired tone, Pain, Hypomobility, Impaired flexibility, Decreased strength, Increased muscle spasms  Visit Diagnosis: Cervicalgia  Other muscle spasm  Myalgia       G-Codes - 12-15-2015 1602    Functional Assessment Tool Used Clinical Judgment    Functional  Limitation Changing and maintaining body position   Changing and Maintaining Body Position Goal Status [phone]) 0 percent impaired, limited or restricted   Changing and Maintaining Body Position Discharge Status (U0156) 0 percent impaired, limited or restricted      Problem List Patient Active Problem List   Diagnosis Date Noted  . Abnormal weight gain 07/28/2015  . 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 Pain in joint, shoulder region 05/03/2012  . Impingement syndrome of left shoulder 05/03/2012  . 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 PALPITATIONS 03/13/2010  4:03 PM, 11/18/15 Etta Grandchild, PT, DPT Physical Therapist at [facility] [phone] (office)     [facility] [address] [place], [place], [address] Phone: [phone]   Fax:  [phone]  Name: LESHAWN HOUSEWORTH MRN: [id number] Date of Birth: 28-Sep-1945

## 2015-11-20 ENCOUNTER — Encounter (HOSPITAL_COMMUNITY): Payer: Self-pay

## 2015-11-25 ENCOUNTER — Encounter (HOSPITAL_COMMUNITY): Payer: Self-pay

## 2015-11-27 ENCOUNTER — Encounter (HOSPITAL_COMMUNITY): Payer: Self-pay

## 2015-12-02 ENCOUNTER — Encounter (HOSPITAL_COMMUNITY): Payer: Self-pay

## 2015-12-04 ENCOUNTER — Encounter (HOSPITAL_COMMUNITY): Payer: Self-pay | Admitting: Physical Therapy

## 2015-12-11 DIAGNOSIS — J300 Vasomotor rhinitis: Secondary | ICD-10-CM | POA: Diagnosis not present

## 2015-12-11 DIAGNOSIS — J3089 Other allergic rhinitis: Secondary | ICD-10-CM | POA: Diagnosis not present

## 2015-12-11 DIAGNOSIS — Z713 Dietary counseling and surveillance: Secondary | ICD-10-CM | POA: Diagnosis not present

## 2015-12-22 DIAGNOSIS — R635 Abnormal weight gain: Secondary | ICD-10-CM | POA: Diagnosis not present

## 2015-12-22 DIAGNOSIS — Z01419 Encounter for gynecological examination (general) (routine) without abnormal findings: Secondary | ICD-10-CM | POA: Diagnosis not present

## 2015-12-22 DIAGNOSIS — Z124 Encounter for screening for malignant neoplasm of cervix: Secondary | ICD-10-CM | POA: Diagnosis not present

## 2015-12-25 DIAGNOSIS — Z0189 Encounter for other specified special examinations: Secondary | ICD-10-CM | POA: Diagnosis not present

## 2015-12-25 DIAGNOSIS — Z713 Dietary counseling and surveillance: Secondary | ICD-10-CM | POA: Diagnosis not present

## 2015-12-25 DIAGNOSIS — R635 Abnormal weight gain: Secondary | ICD-10-CM | POA: Diagnosis not present

## 2015-12-25 DIAGNOSIS — Z6821 Body mass index (BMI) 21.0-21.9, adult: Secondary | ICD-10-CM | POA: Diagnosis not present

## 2016-01-26 DIAGNOSIS — Z803 Family history of malignant neoplasm of breast: Secondary | ICD-10-CM | POA: Diagnosis not present

## 2016-01-26 DIAGNOSIS — M810 Age-related osteoporosis without current pathological fracture: Secondary | ICD-10-CM | POA: Diagnosis not present

## 2016-01-26 DIAGNOSIS — Z1231 Encounter for screening mammogram for malignant neoplasm of breast: Secondary | ICD-10-CM | POA: Diagnosis not present

## 2016-03-10 DIAGNOSIS — Z713 Dietary counseling and surveillance: Secondary | ICD-10-CM | POA: Diagnosis not present

## 2016-03-10 DIAGNOSIS — E785 Hyperlipidemia, unspecified: Secondary | ICD-10-CM | POA: Diagnosis not present

## 2016-03-10 DIAGNOSIS — R635 Abnormal weight gain: Secondary | ICD-10-CM | POA: Diagnosis not present

## 2016-03-16 DIAGNOSIS — G44219 Episodic tension-type headache, not intractable: Secondary | ICD-10-CM | POA: Diagnosis not present

## 2016-03-16 DIAGNOSIS — R51 Headache: Secondary | ICD-10-CM | POA: Diagnosis not present

## 2016-03-16 DIAGNOSIS — J300 Vasomotor rhinitis: Secondary | ICD-10-CM | POA: Diagnosis not present

## 2016-03-16 DIAGNOSIS — R202 Paresthesia of skin: Secondary | ICD-10-CM | POA: Diagnosis not present

## 2016-03-23 DIAGNOSIS — E785 Hyperlipidemia, unspecified: Secondary | ICD-10-CM | POA: Diagnosis not present

## 2016-03-23 DIAGNOSIS — R635 Abnormal weight gain: Secondary | ICD-10-CM | POA: Diagnosis not present

## 2016-03-23 DIAGNOSIS — Z713 Dietary counseling and surveillance: Secondary | ICD-10-CM | POA: Diagnosis not present

## 2016-04-22 DIAGNOSIS — Z6821 Body mass index (BMI) 21.0-21.9, adult: Secondary | ICD-10-CM | POA: Diagnosis not present

## 2016-04-22 DIAGNOSIS — Z713 Dietary counseling and surveillance: Secondary | ICD-10-CM | POA: Diagnosis not present

## 2016-04-22 DIAGNOSIS — J300 Vasomotor rhinitis: Secondary | ICD-10-CM | POA: Diagnosis not present

## 2016-05-21 ENCOUNTER — Other Ambulatory Visit (INDEPENDENT_AMBULATORY_CARE_PROVIDER_SITE_OTHER): Payer: Self-pay | Admitting: *Deleted

## 2016-05-21 DIAGNOSIS — Z1211 Encounter for screening for malignant neoplasm of colon: Secondary | ICD-10-CM | POA: Insufficient documentation

## 2016-06-10 ENCOUNTER — Encounter (INDEPENDENT_AMBULATORY_CARE_PROVIDER_SITE_OTHER): Payer: Self-pay | Admitting: *Deleted

## 2016-06-10 ENCOUNTER — Telehealth (INDEPENDENT_AMBULATORY_CARE_PROVIDER_SITE_OTHER): Payer: Self-pay | Admitting: *Deleted

## 2016-06-10 NOTE — Telephone Encounter (Signed)
agree

## 2016-06-10 NOTE — Telephone Encounter (Signed)
Patient needs trilyte 

## 2016-06-10 NOTE — Telephone Encounter (Signed)
Referring MD/PCP: fagan   Procedure: tcs  Reason/Indication:  screening  Has patient had this procedure before?  Yes, 2006  If so, when, by whom and where?    Is there a family history of colon cancer?  no  Who?  What age when diagnosed?    Is patient diabetic?   no      Does patient have prosthetic heart valve or mechanical valve?  no  Do you have a pacemaker?  no  Has patient ever had endocarditis? no  Has patient had joint replacement within last 12 months?  no  Does patient tend to be constipated or take laxatives? no  Does patient have a history of alcohol/drug use?  no  Is patient on Coumadin, Plavix and/or Aspirin? no  Medications: desloratadine 5 mg daily, nasacort daily, vit d3 1000 iu daily, MVI daily, risedrinate 35 mg 1 tab weekly, elderberry prn  Allergies: pcn, sulfur  Medication Adjustment per Dr Laural Golden:   Procedure date & time: 07/08/16 at 1030

## 2016-06-15 MED ORDER — PEG 3350-KCL-NA BICARB-NACL 420 G PO SOLR: 4000.0000 mL | Freq: Once | ORAL | 0 refills | Status: AC

## 2016-06-29 DIAGNOSIS — Z8659 Personal history of other mental and behavioral disorders: Secondary | ICD-10-CM | POA: Diagnosis not present

## 2016-06-29 DIAGNOSIS — F4321 Adjustment disorder with depressed mood: Secondary | ICD-10-CM | POA: Diagnosis not present

## 2016-06-30 ENCOUNTER — Encounter (INDEPENDENT_AMBULATORY_CARE_PROVIDER_SITE_OTHER): Payer: Self-pay | Admitting: *Deleted

## 2016-06-30 DIAGNOSIS — H524 Presbyopia: Secondary | ICD-10-CM | POA: Diagnosis not present

## 2016-06-30 DIAGNOSIS — H2513 Age-related nuclear cataract, bilateral: Secondary | ICD-10-CM | POA: Diagnosis not present

## 2016-06-30 DIAGNOSIS — H5213 Myopia, bilateral: Secondary | ICD-10-CM | POA: Diagnosis not present

## 2016-06-30 DIAGNOSIS — H43393 Other vitreous opacities, bilateral: Secondary | ICD-10-CM | POA: Diagnosis not present

## 2016-06-30 DIAGNOSIS — H52203 Unspecified astigmatism, bilateral: Secondary | ICD-10-CM | POA: Diagnosis not present

## 2016-07-20 ENCOUNTER — Encounter (INDEPENDENT_AMBULATORY_CARE_PROVIDER_SITE_OTHER): Payer: Self-pay | Admitting: *Deleted

## 2016-08-17 DIAGNOSIS — G44219 Episodic tension-type headache, not intractable: Secondary | ICD-10-CM | POA: Diagnosis not present

## 2016-08-17 DIAGNOSIS — R202 Paresthesia of skin: Secondary | ICD-10-CM | POA: Diagnosis not present

## 2016-08-17 DIAGNOSIS — J300 Vasomotor rhinitis: Secondary | ICD-10-CM | POA: Diagnosis not present

## 2016-08-17 DIAGNOSIS — R51 Headache: Secondary | ICD-10-CM | POA: Diagnosis not present

## 2016-09-16 DIAGNOSIS — F4322 Adjustment disorder with anxiety: Secondary | ICD-10-CM | POA: Diagnosis not present

## 2016-10-01 ENCOUNTER — Telehealth (INDEPENDENT_AMBULATORY_CARE_PROVIDER_SITE_OTHER): Payer: Self-pay | Admitting: *Deleted

## 2016-10-01 NOTE — Telephone Encounter (Signed)
Referring MD/PCP: fagan   Procedure: tcs  Reason/Indication:  screening  Has patient had this procedure before?  Yes, 2006             If so, when, by whom and where?    Is there a family history of colon cancer?  no             Who?  What age when diagnosed?    Is patient diabetic?   no                                                  Does patient have prosthetic heart valve or mechanical valve?  no  Do you have a pacemaker?  no  Has patient ever had endocarditis? no  Has patient had joint replacement within last 12 months?  no  Does patient tend to be constipated or take laxatives? no  Does patient have a history of alcohol/drug use?  no  Is patient on Coumadin, Plavix and/or Aspirin? no  Medications: desloratadine 5 mg daily, nasacort daily, vit d3 1000 iu daily, MVI daily, risedrinate 35 mg 1 tab weekly, elderberry prn  Allergies: pcn, sulfur  Medication Adjustment per Dr Laural Golden:   Procedure date & time: 11/04/16 at 730

## 2016-10-05 NOTE — Telephone Encounter (Signed)
agree

## 2016-10-13 DIAGNOSIS — L820 Inflamed seborrheic keratosis: Secondary | ICD-10-CM | POA: Diagnosis not present

## 2016-10-13 DIAGNOSIS — D2371 Other benign neoplasm of skin of right lower limb, including hip: Secondary | ICD-10-CM | POA: Diagnosis not present

## 2016-10-13 DIAGNOSIS — D2271 Melanocytic nevi of right lower limb, including hip: Secondary | ICD-10-CM | POA: Diagnosis not present

## 2016-10-13 DIAGNOSIS — D2261 Melanocytic nevi of right upper limb, including shoulder: Secondary | ICD-10-CM | POA: Diagnosis not present

## 2016-10-13 DIAGNOSIS — D2272 Melanocytic nevi of left lower limb, including hip: Secondary | ICD-10-CM | POA: Diagnosis not present

## 2016-10-13 DIAGNOSIS — D2239 Melanocytic nevi of other parts of face: Secondary | ICD-10-CM | POA: Diagnosis not present

## 2016-10-13 DIAGNOSIS — D2262 Melanocytic nevi of left upper limb, including shoulder: Secondary | ICD-10-CM | POA: Diagnosis not present

## 2016-10-13 DIAGNOSIS — Z85828 Personal history of other malignant neoplasm of skin: Secondary | ICD-10-CM | POA: Diagnosis not present

## 2016-10-13 DIAGNOSIS — L821 Other seborrheic keratosis: Secondary | ICD-10-CM | POA: Diagnosis not present

## 2016-10-13 DIAGNOSIS — D225 Melanocytic nevi of trunk: Secondary | ICD-10-CM | POA: Diagnosis not present

## 2016-10-13 DIAGNOSIS — L814 Other melanin hyperpigmentation: Secondary | ICD-10-CM | POA: Diagnosis not present

## 2016-10-13 DIAGNOSIS — D1801 Hemangioma of skin and subcutaneous tissue: Secondary | ICD-10-CM | POA: Diagnosis not present

## 2016-11-04 ENCOUNTER — Ambulatory Visit (HOSPITAL_COMMUNITY): Admission: RE | Admit: 2016-11-04 | Payer: Medicare Other | Source: Ambulatory Visit | Admitting: Internal Medicine

## 2016-11-04 ENCOUNTER — Encounter (HOSPITAL_COMMUNITY): Admission: RE | Payer: Self-pay | Source: Ambulatory Visit

## 2016-11-04 SURGERY — COLONOSCOPY
Anesthesia: Moderate Sedation

## 2016-11-08 DIAGNOSIS — H1032 Unspecified acute conjunctivitis, left eye: Secondary | ICD-10-CM | POA: Diagnosis not present

## 2016-11-08 DIAGNOSIS — K649 Unspecified hemorrhoids: Secondary | ICD-10-CM | POA: Diagnosis not present

## 2016-12-02 DIAGNOSIS — Z23 Encounter for immunization: Secondary | ICD-10-CM | POA: Diagnosis not present

## 2016-12-06 DIAGNOSIS — J3089 Other allergic rhinitis: Secondary | ICD-10-CM | POA: Diagnosis not present

## 2016-12-06 DIAGNOSIS — J300 Vasomotor rhinitis: Secondary | ICD-10-CM | POA: Diagnosis not present

## 2016-12-07 IMAGING — DX DG ANKLE COMPLETE 3+V*L*
3 series · 3 of 3 positions shown · non-contrast
Comparison: None.

CLINICAL DATA: Initial evaluation for lateral malleolus pain,
twisted left ankle yesterday

EXAM:
LEFT ANKLE COMPLETE - 3+ VIEW

[ankle ap]
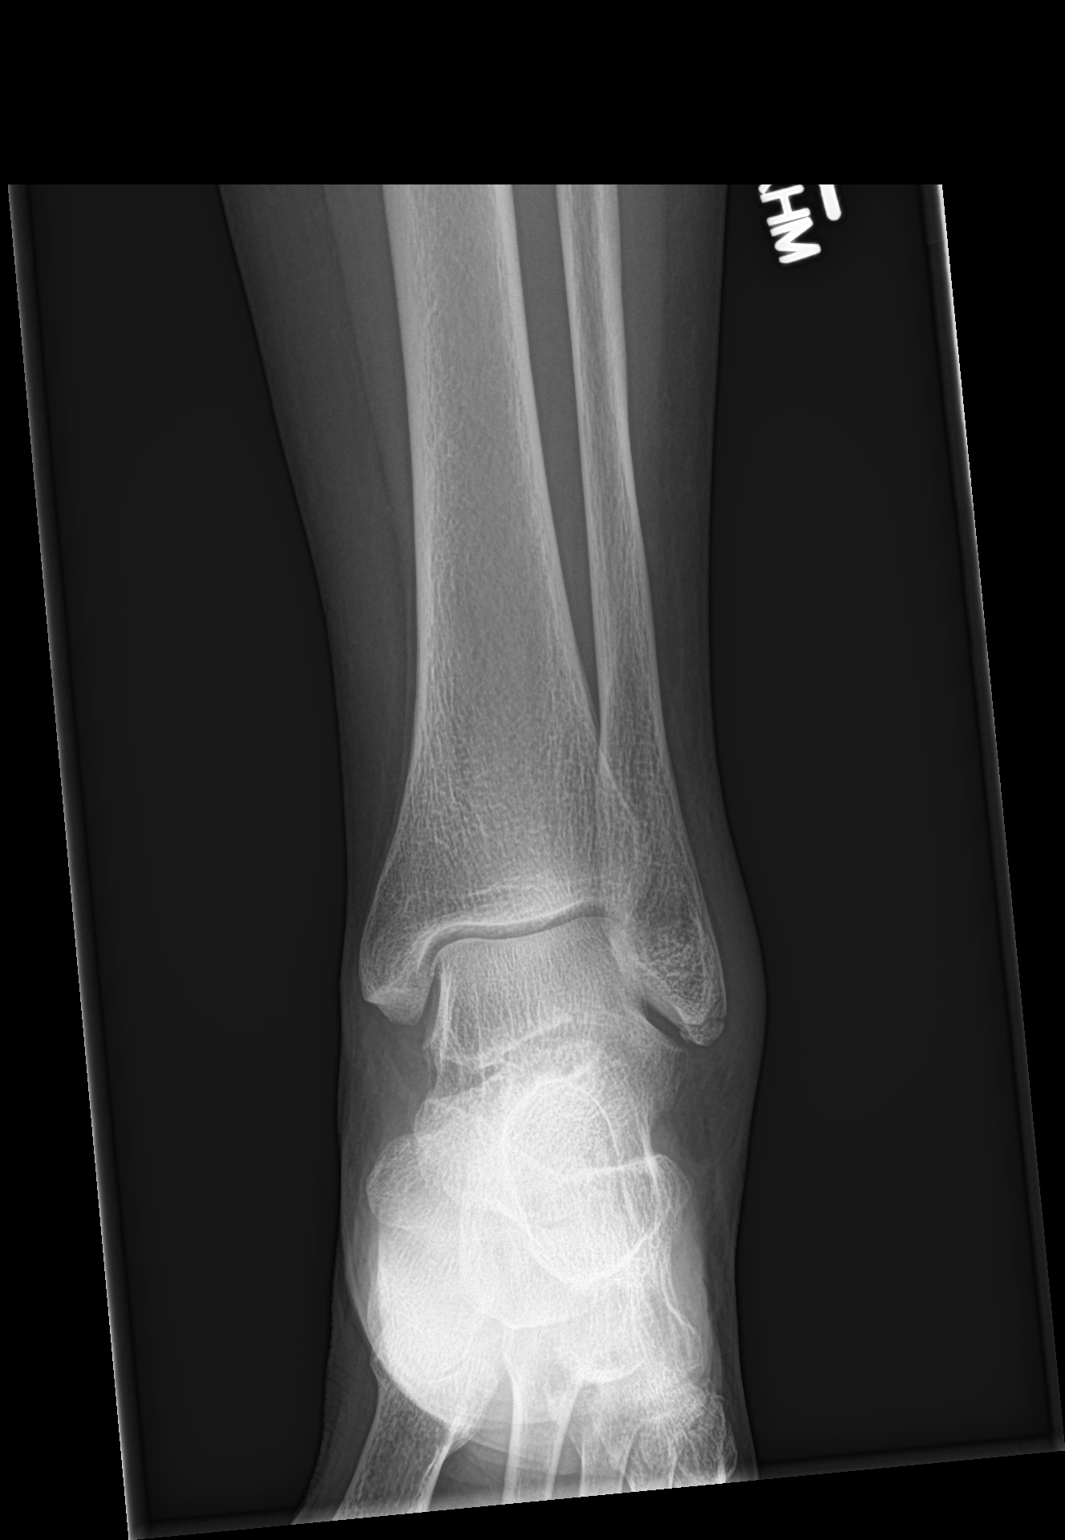

[ankle obl]
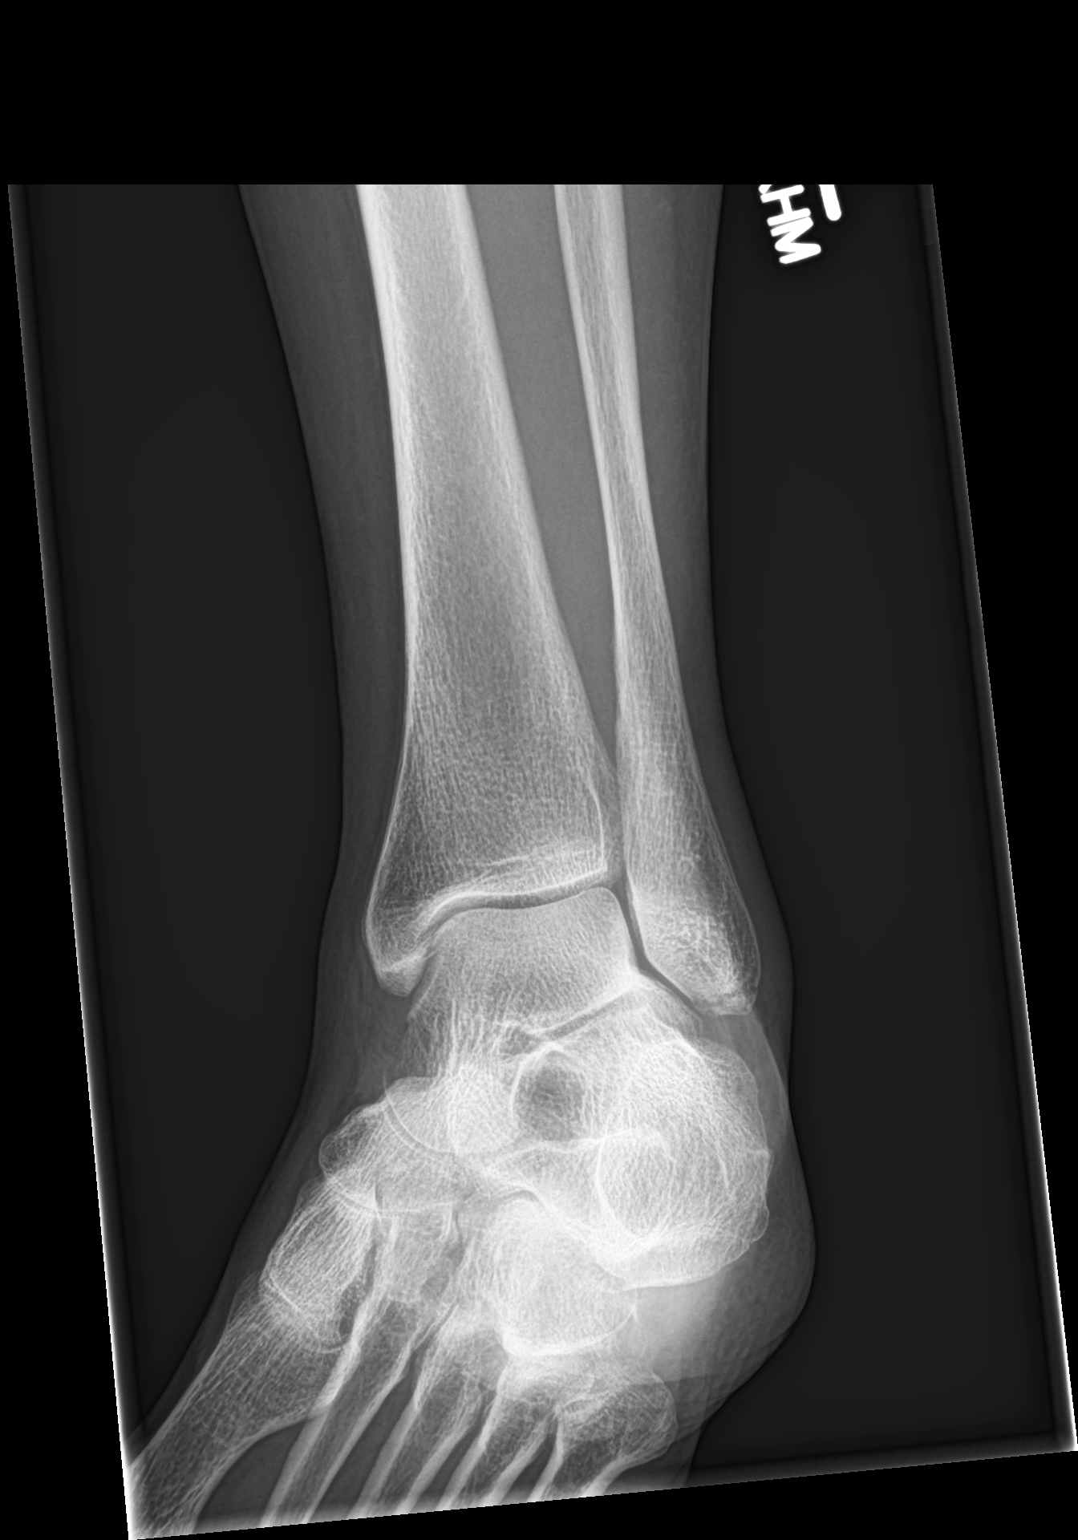

[ankle lat]
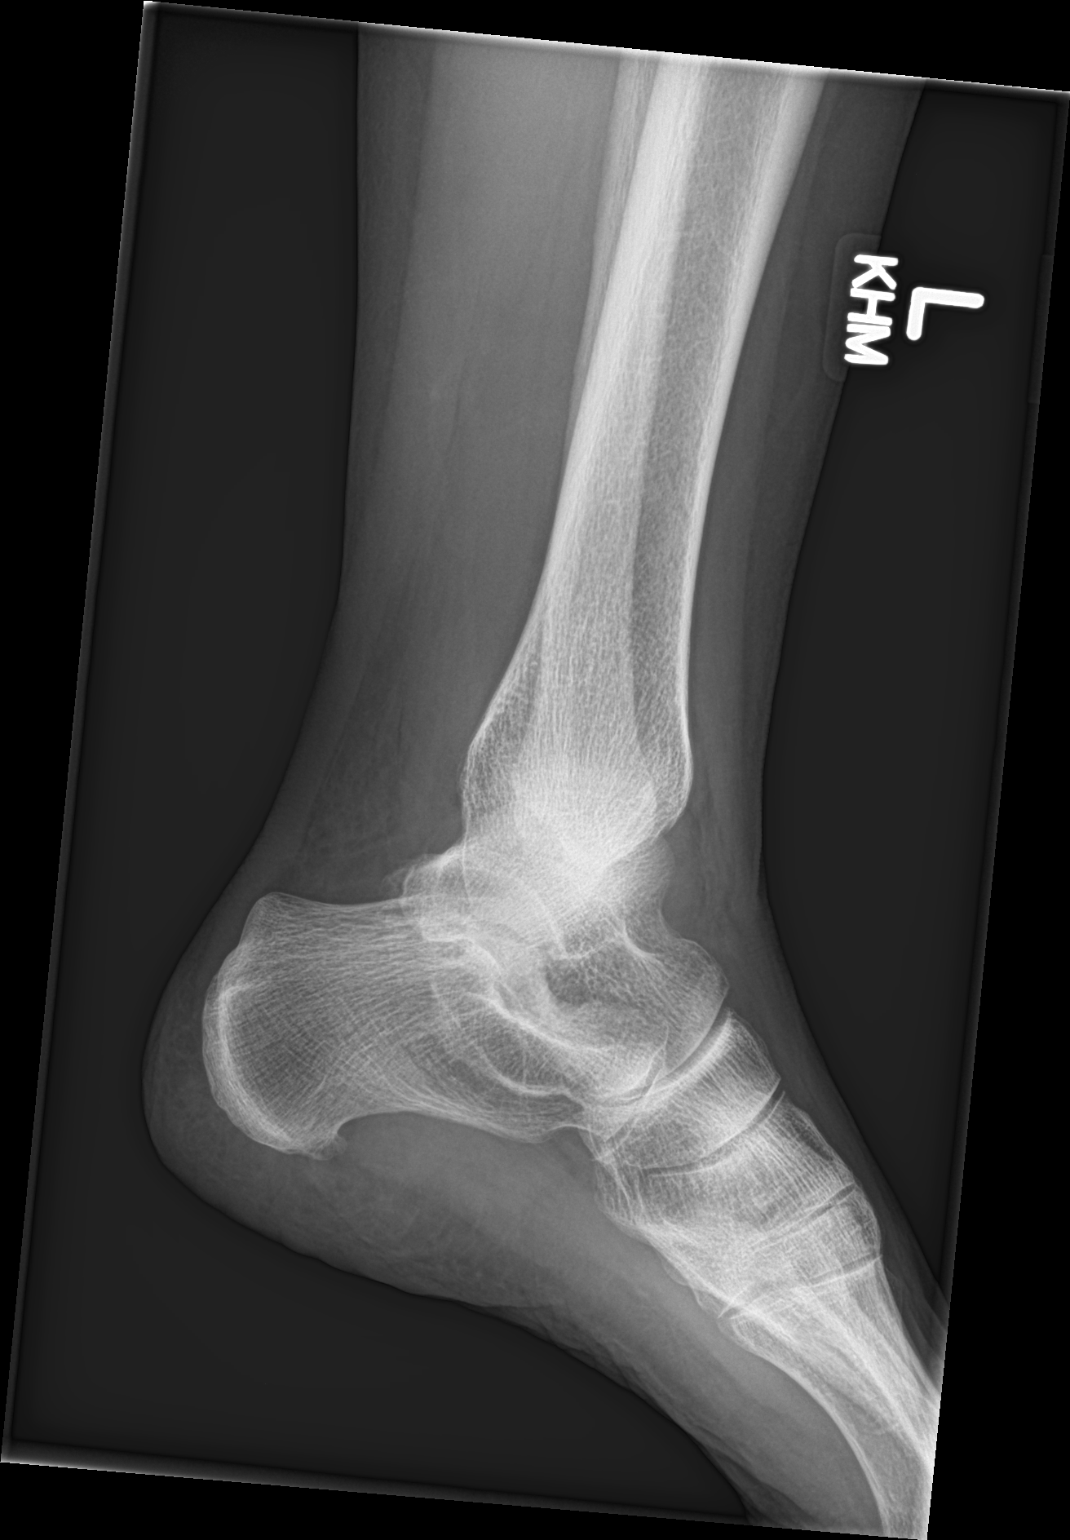

[3 of 3 positions shown; findings below may reference images not displayed]

FINDINGS: Soft tissue swelling over the lateral malleolus. There is a
nondisplaced fracture involving the tip of the lateral malleolus.
There is a small ankle joint effusion. There are no other
abnormalities.
IMPRESSION: Nondisplaced fracture involving the tip of the lateral malleolus.

## 2016-12-24 DIAGNOSIS — S92421A Displaced fracture of distal phalanx of right great toe, initial encounter for closed fracture: Secondary | ICD-10-CM | POA: Diagnosis not present

## 2016-12-24 DIAGNOSIS — M79674 Pain in right toe(s): Secondary | ICD-10-CM | POA: Diagnosis not present

## 2016-12-30 DIAGNOSIS — Z124 Encounter for screening for malignant neoplasm of cervix: Secondary | ICD-10-CM | POA: Diagnosis not present

## 2016-12-30 DIAGNOSIS — M810 Age-related osteoporosis without current pathological fracture: Secondary | ICD-10-CM | POA: Diagnosis not present

## 2016-12-30 DIAGNOSIS — Z01411 Encounter for gynecological examination (general) (routine) with abnormal findings: Secondary | ICD-10-CM | POA: Diagnosis not present

## 2017-01-07 DIAGNOSIS — S92421D Displaced fracture of distal phalanx of right great toe, subsequent encounter for fracture with routine healing: Secondary | ICD-10-CM | POA: Diagnosis not present

## 2017-01-07 DIAGNOSIS — M79674 Pain in right toe(s): Secondary | ICD-10-CM | POA: Diagnosis not present

## 2017-02-03 DIAGNOSIS — Z1231 Encounter for screening mammogram for malignant neoplasm of breast: Secondary | ICD-10-CM | POA: Diagnosis not present

## 2017-02-18 DIAGNOSIS — S92421D Displaced fracture of distal phalanx of right great toe, subsequent encounter for fracture with routine healing: Secondary | ICD-10-CM | POA: Diagnosis not present

## 2017-03-14 DIAGNOSIS — F419 Anxiety disorder, unspecified: Secondary | ICD-10-CM | POA: Diagnosis not present

## 2017-03-14 DIAGNOSIS — Z79899 Other long term (current) drug therapy: Secondary | ICD-10-CM | POA: Diagnosis not present

## 2017-03-14 DIAGNOSIS — R5383 Other fatigue: Secondary | ICD-10-CM | POA: Diagnosis not present

## 2017-03-14 DIAGNOSIS — Z0000 Encounter for general adult medical examination without abnormal findings: Secondary | ICD-10-CM | POA: Diagnosis not present

## 2017-03-14 DIAGNOSIS — M810 Age-related osteoporosis without current pathological fracture: Secondary | ICD-10-CM | POA: Diagnosis not present

## 2017-03-14 DIAGNOSIS — R002 Palpitations: Secondary | ICD-10-CM | POA: Diagnosis not present

## 2017-03-21 DIAGNOSIS — R5383 Other fatigue: Secondary | ICD-10-CM | POA: Diagnosis not present

## 2017-03-21 DIAGNOSIS — M25562 Pain in left knee: Secondary | ICD-10-CM | POA: Diagnosis not present

## 2017-03-21 DIAGNOSIS — J310 Chronic rhinitis: Secondary | ICD-10-CM | POA: Diagnosis not present

## 2017-03-21 DIAGNOSIS — Z6823 Body mass index (BMI) 23.0-23.9, adult: Secondary | ICD-10-CM | POA: Diagnosis not present

## 2017-03-31 ENCOUNTER — Encounter (HOSPITAL_BASED_OUTPATIENT_CLINIC_OR_DEPARTMENT_OTHER): Payer: Self-pay

## 2017-03-31 DIAGNOSIS — R5383 Other fatigue: Secondary | ICD-10-CM

## 2017-03-31 DIAGNOSIS — R0683 Snoring: Secondary | ICD-10-CM

## 2017-04-07 DIAGNOSIS — M1712 Unilateral primary osteoarthritis, left knee: Secondary | ICD-10-CM | POA: Diagnosis not present

## 2017-04-27 ENCOUNTER — Encounter: Payer: Self-pay | Admitting: Neurology

## 2017-04-27 ENCOUNTER — Ambulatory Visit: Payer: Medicare Other | Attending: Internal Medicine | Admitting: Neurology

## 2017-04-27 DIAGNOSIS — R0683 Snoring: Secondary | ICD-10-CM | POA: Insufficient documentation

## 2017-04-27 DIAGNOSIS — G4733 Obstructive sleep apnea (adult) (pediatric): Secondary | ICD-10-CM | POA: Insufficient documentation

## 2017-04-27 DIAGNOSIS — R5383 Other fatigue: Secondary | ICD-10-CM | POA: Insufficient documentation

## 2017-05-04 NOTE — Procedures (Signed)
[place] A. Merlene Laughter, MD     [email or web address]             NOCTURNAL POLYSOMNOGRAPHY   LOCATION: [facility]    Patient Name: Veronica Snyder, Topor Date: 04/27/2017 Gender: Female D.O.B: February 02, 1946 Age (years): 71 Referring Provider: Asencion Noble Height (inches): 5 Interpreting Physician: Phillips Odor MD, ABSM Weight (lbs): 150 RPSGT: Peak, Robert BMI: 23 MRN: [id number] Neck Size: 13.50 CLINICAL INFORMATION Sleep Study Type: Split Night CPAP     Indication for sleep study: Fatigue, Snoring     Epworth Sleepiness Score: 8  SLEEP STUDY TECHNIQUE As per the AASM Manual for the Scoring of Sleep and Associated Events v2.3 (April 2016) with a hypopnea requiring 4% desaturations.  The channels recorded and monitored were frontal, central and occipital EEG, electrooculogram (EOG), submentalis EMG (chin), nasal and oral airflow, thoracic and abdominal wall motion, anterior tibialis EMG, snore microphone, electrocardiogram, and pulse oximetry. Continuous positive airway pressure (CPAP) was initiated when the patient met split night criteria and was titrated according to treat sleep-disordered breathing.  MEDICATIONS Medications self-administered by patient taken the night of the study : N/A  Current Outpatient Medications:  .  cephALEXin (KEFLEX) 500 MG capsule, Take 1 capsule (500 mg total) by mouth 2 (two) times daily. (Patient not taking: Reported on 10/24/2015), Disp: 14 capsule, Rfl: 0 .  Desloratadine (CLARINEX PO), Take 1 tablet by mouth at bedtime. , Disp: , Rfl:  .  diclofenac sodium (VOLTAREN) 1 % GEL, Apply 4 g topically 4 (four) times daily. (Patient not taking: Reported on 10/24/2015), Disp: 100 g, Rfl: 4 .  HYDROcodone-acetaminophen (NORCO/VICODIN) 5-325 MG tablet, Take 1 tablet by mouth every 6 (six) hours as needed for moderate pain. (Patient not taking: Reported on 10/24/2015), Disp: 20 tablet, Rfl: 0 .  imipramine (TOFRANIL) 10 MG tablet,  Take 10 mg by mouth at bedtime. , Disp: , Rfl:  .  ipratropium (ATROVENT) 0.06 % nasal spray, Place 1 spray into both nostrils daily. , Disp: , Rfl:  .  Multiple Vitamins-Minerals (MULTIVITAMIN ADULT PO), Take 1 tablet by mouth daily., Disp: , Rfl:  .  triamcinolone (NASACORT) 55 MCG/ACT AERO nasal inhaler, Place 1 spray into the nose daily. , Disp: , Rfl:     RESPIRATORY PARAMETERS Diagnostic  Total AHI (/hr): 5.5 RDI (/hr): 5.5 OA Index (/hr): 0.8 CA Index (/hr): 5.5 REM AHI (/hr): 5.5 NREM AHI (/hr): 5.5 Supine AHI (/hr): 5.5 Non-supine AHI (/hr): 5.55 Min O2 Sat (%): 100.00 Mean O2 (%): 100.00 Time below 88% (min): 5.5   Titration  Optimal Pressure (cm):  AHI at Optimal Pressure (/hr): N/A Min O2 at Optimal Pressure (%): 100.00 Supine % at Optimal (%): N/A Sleep % at Optimal (%): N/A   SLEEP ARCHITECTURE The recording time for the entire night was 480.0 minutes.  During a baseline period of 480.0 minutes, the patient slept for 380.0 minutes in REM and nonREM, yielding a sleep efficiency of 222.2%. Sleep onset after lights out was 20.0 minutes with a REM latency of 5.5 minutes. The patient spent 5.55% of the night in stage N1 sleep, 5.55% in stage N2 sleep, 5.55% in stage N3 and 5.55% in REM.     During the titration period of 480.0 minutes, the patient slept for 380.0 minutes in REM and nonREM, yielding a sleep efficiency of 222.2%. Sleep onset after CPAP initiation was 20.0 minutes with a REM latency of 5.5 minutes. The patient spent 5.55% of the night in stage N1 sleep,  5.55% in stage N2 sleep, 5.55% in stage N3 and 5.55% in REM.  CARDIAC DATA The 2 lead EKG demonstrated sinus rhythm. The mean heart rate was 100.00 beats per minute. Other EKG findings include: None. LEG MOVEMENT DATA The total Periodic Limb Movements of Sleep (PLMS) were 5. The PLMS index was 5.55.     IMPRESSIONS Mild obstructive sleep apnea occurred during the diagnostic portion of the study (AHI = 5.5  /hour). Positive pressure treatment is not required.  Mild central sleep apnea occurred during the diagnostic portion of the study (CAI = 5.5/hour). Positive pressure treatment is also not required.  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   Delano Metz, MD Diplomate, American Board of Sleep Medicine.  ELECTRONICALLY SIGNED ON:  05/04/2017, 12:37 PM [place] PH: [phone]   FX: [phone] [place]

## 2017-05-27 DIAGNOSIS — H2513 Age-related nuclear cataract, bilateral: Secondary | ICD-10-CM | POA: Diagnosis not present

## 2017-05-27 DIAGNOSIS — H524 Presbyopia: Secondary | ICD-10-CM | POA: Diagnosis not present

## 2017-05-27 DIAGNOSIS — H43813 Vitreous degeneration, bilateral: Secondary | ICD-10-CM | POA: Diagnosis not present

## 2017-05-27 DIAGNOSIS — H04123 Dry eye syndrome of bilateral lacrimal glands: Secondary | ICD-10-CM | POA: Diagnosis not present

## 2017-05-31 DIAGNOSIS — S90129A Contusion of unspecified lesser toe(s) without damage to nail, initial encounter: Secondary | ICD-10-CM | POA: Diagnosis not present

## 2017-05-31 DIAGNOSIS — M79675 Pain in left toe(s): Secondary | ICD-10-CM | POA: Diagnosis not present

## 2017-07-19 ENCOUNTER — Telehealth: Payer: Self-pay | Admitting: Pulmonary Disease

## 2017-07-19 ENCOUNTER — Ambulatory Visit (INDEPENDENT_AMBULATORY_CARE_PROVIDER_SITE_OTHER): Payer: Medicare Other | Admitting: Pulmonary Disease

## 2017-07-19 ENCOUNTER — Encounter: Payer: Self-pay | Admitting: Pulmonary Disease

## 2017-07-19 DIAGNOSIS — G4733 Obstructive sleep apnea (adult) (pediatric): Secondary | ICD-10-CM

## 2017-07-19 DIAGNOSIS — J300 Vasomotor rhinitis: Secondary | ICD-10-CM | POA: Diagnosis not present

## 2017-07-19 NOTE — Patient Instructions (Signed)
You have moderate obstructive sleep apnea   prescription for CPAP 7 cm with air fit nasal pillows for her Will be sent to [facility]

## 2017-07-19 NOTE — Assessment & Plan Note (Signed)
Continue  Atrovent spray, hopefully this will not be worsened by CPAP, will get her humidifier with CPAP

## 2017-07-19 NOTE — Telephone Encounter (Signed)
Per RA, will need to send in a RX to [place] for a CPAP 7cm. Air fit nasal pillows for her.   Called sleep lab and no one answered. Will need to get patient's raw data from her SS in February to be correctly read per RA.

## 2017-07-19 NOTE — Progress Notes (Signed)
Subjective:    Patient ID: Veronica Snyder, female    DOB: Dec 06, 1945, 72 y.o.   MRN: [id number]  HPI  72 year old woman presents for evaluation of sleep disordered breathing. Her husband who passed away in November 05, 2016 and always commented on her loud snoring and he had to sleep in a different room.  She is also experienced daytime tiredness and somnolence for the last few years.  She feels always tired in spite of enough sleep quantity.  PCP is check TSH and B12 levels which were normal. Epworth sleepiness score is 10. Vitamins with intended 30 p.m., sleep latency is about 50 minutes, she generally sleeps on her back and now has a sleep number bed which she can elevate to about 30 degrees.  She reports 2-5 nocturnal awakenings including one bathroom visit and is out of bed by 830 feeling tired with frequent morning headaches and dryness of mouth. She naps anywhere from 15 minutes to an hour every day and wakes up feeling refreshed. She has gained about 10 pounds in the last 2 years.  I reviewed her sleep study in detail including the raw data.  The report seems to be inaccurate when compared to the [place]. During the baseline portion, total sleep time was 121 minutes with AHI of about 18/hours with RDI of 32/hour.  This was corrected by CPAP of 7 cm with nasal pillows.  Most of the time was spent in 6 cm in a few central seem to emerge in this level.  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 Palpitations    Short runs of SVT by event recording associated with patient's symptoms 2012     Social History   Socioeconomic History  . Marital status: Married    Spouse name: Not on file  . Number of children: Not on file  . Years of education: Not  on file  . Highest education level: Not on file  Occupational History  . Occupation: Does not work outside of home    Employer: UNEMPLOYED  Social Needs  . Financial resource strain: Not on file  . Food insecurity:    Worry: Not on file    Inability: Not on file  . Transportation needs:    Medical: Not on file    Non-medical: Not on file  Tobacco Use  . Smoking status: Never Smoker  . Smokeless tobacco: Never Used  Substance and Sexual Activity  . Alcohol use: No  . Drug use: Not on file  . Sexual activity: Not on file  Lifestyle  . Physical activity:    Days per week: Not on file    Minutes per session: Not on file  . Stress: Not on file  Relationships  . Social connections:    Talks on phone: Not on file    Gets together: Not on file    Attends religious service: Not on file    Active member of club or organization: Not on file    Attends meetings of clubs or organizations: Not on file    Relationship status: Not on file  . Intimate partner violence:    Fear of current or ex partner: Not on file    Emotionally abused: Not on  file    Physically abused: Not on file    Forced sexual activity: Not on file  Other Topics Concern  . Not on file  Social History Narrative   Married with 2 children      Family History  Problem Relation Age of Onset  . Alzheimer's disease Father      Review of Systems Positive for nasal congestion, headaches, change in color of mucus  Constitutional: negative for anorexia, fevers and sweats  Eyes: negative for irritation, redness and visual disturbance  Ears, nose, mouth, throat, and face: negative for earaches, epistaxis, nasal congestion and sore throat  Respiratory: negative for cough, dyspnea on exertion, sputum and wheezing  Cardiovascular: negative for chest pain, dyspnea, lower extremity edema, orthopnea, palpitations and syncope  Gastrointestinal: negative for abdominal pain, constipation, diarrhea, melena, nausea and  vomiting  Genitourinary:negative for dysuria, frequency and hematuria  Hematologic/lymphatic: negative for bleeding, easy bruising and lymphadenopathy  Musculoskeletal:negative for arthralgias, muscle weakness and stiff joints  Neurological: negative for coordination problems, gait problems, headaches and weakness  Endocrine: negative for diabetic symptoms including polydipsia, polyuria and weight loss     Objective:   Physical Exam   Gen. Pleasant, well-nourished, in no distress, normal affect ENT - low set jaw, overbite, no post nasal drip Neck: No JVD, no thyromegaly, no carotid bruits Lungs: no use of accessory muscles, no dullness to percussion, clear without rales or rhonchi  Cardiovascular: Rhythm regular, heart sounds  normal, no murmurs or gallops, no peripheral edema Abdomen: soft and non-tender, no hepatosplenomegaly, BS normal. Musculoskeletal: No deformities, no cyanosis or clubbing Neuro:  alert, non focal        Assessment & Plan:

## 2017-07-19 NOTE — Assessment & Plan Note (Signed)
moderate obstructive sleep apnea   prescription for CPAP 7 cm with air fit nasal pillows for her Will be sent to [facility] We will ask sleep/manager to modify the report to reflect the raw data accurately  The pathophysiology of obstructive sleep apnea , it's cardiovascular consequences & modes of treatment including CPAP were discused with the patient in detail & they evidenced understanding.

## 2017-07-20 DIAGNOSIS — J0100 Acute maxillary sinusitis, unspecified: Secondary | ICD-10-CM | POA: Diagnosis not present

## 2017-07-20 DIAGNOSIS — J3089 Other allergic rhinitis: Secondary | ICD-10-CM | POA: Diagnosis not present

## 2017-07-20 DIAGNOSIS — R05 Cough: Secondary | ICD-10-CM | POA: Diagnosis not present

## 2017-07-20 DIAGNOSIS — J300 Vasomotor rhinitis: Secondary | ICD-10-CM | POA: Diagnosis not present

## 2017-10-10 ENCOUNTER — Encounter (HOSPITAL_COMMUNITY): Payer: Self-pay

## 2017-10-10 ENCOUNTER — Ambulatory Visit (HOSPITAL_COMMUNITY)
Admission: EM | Admit: 2017-10-10 | Discharge: 2017-10-10 | Disposition: A | Discharge status: Home / Self Care | Payer: Medicare Other | Attending: Family Medicine | Admitting: Family Medicine

## 2017-10-10 DIAGNOSIS — Z79899 Other long term (current) drug therapy: Secondary | ICD-10-CM | POA: Diagnosis not present

## 2017-10-10 DIAGNOSIS — R002 Palpitations: Secondary | ICD-10-CM | POA: Insufficient documentation

## 2017-10-10 DIAGNOSIS — N390 Urinary tract infection, site not specified: Secondary | ICD-10-CM | POA: Diagnosis not present

## 2017-10-10 DIAGNOSIS — G4733 Obstructive sleep apnea (adult) (pediatric): Secondary | ICD-10-CM | POA: Diagnosis not present

## 2017-10-10 LAB — POCT URINALYSIS DIP (DEVICE)
BILIRUBIN URINE: NEGATIVE
Glucose, UA: NEGATIVE mg/dL
Ketones, ur: NEGATIVE mg/dL
NITRITE: NEGATIVE
PH: 6.5 (ref 5.0–8.0)
Protein, ur: NEGATIVE mg/dL
Urobilinogen, UA: 0.2 mg/dL (ref 0.0–1.0)

## 2017-10-10 MED ORDER — CIPROFLOXACIN HCL 500 MG PO TABS: 500.0000 mg | ORAL_TABLET | Freq: Two times a day (BID) | ORAL | 0 refills | Status: AC

## 2017-10-10 NOTE — Discharge Instructions (Signed)
Complete course of antibiotics.   Drink plenty of water to empty bladder regularly. Avoid alcohol and caffeine as these may irritate the bladder.   I have sent your urine for culture and sensitivity. We will notify you if any results indicate a change needed in treatment.  If symptoms worsen or do not improve in the next week to return to be seen or to follow up with your PCP.

## 2017-10-10 NOTE — ED Provider Notes (Signed)
Veronica Snyder    CSN: [id number] Arrival date & time: 10/10/17  1011     History   Chief Complaint Chief Complaint  Patient presents with  . Urinary Tract Infection    HPI Veronica Snyder is a 72 y.o. female.   Veronica Snyder presents with complaints of urgency, frequency and pain with urination which started yesterday. Pelvic pressure. Noted pink blood in urine starting last night. No fevers. No back pain. No nausea or vomiting, no diarrhea. States has had multiple UTI's in the past and has required multiple different antibiotics, has been 5 years since last however. Cipro was last effective antibiotic. Has seen urology in the past. Hx of palpitations, osa.     ROS per HPI.      Past Medical History:  Diagnosis Date  . Palpitations    Short runs of SVT by event recording associated with patient's symptoms 2012    Patient Active Problem List   Diagnosis Date Noted  . OSA (obstructive sleep apnea) 07/19/2017  . Special screening for malignant neoplasms, colon 05/21/2016  . Abnormal weight gain 07/28/2015  . 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 Pain in joint, shoulder region 05/03/2012  . Impingement syndrome of left shoulder 05/03/2012  . 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 PALPITATIONS 03/13/2010    Past Surgical History:  Procedure Laterality Date  . COLONOSCOPY  2006   Negative  . IRRIGATION AND DEBRIDEMENT SEBACEOUS CYST  2005  . ORIF DISTAL RADIUS FRACTURE  2006   Right  . WISDOM TOOTH EXTRACTION      OB History   None      Home Medications    Prior to Admission medications   Medication Sig Start Date End Date Taking? Authorizing Provider  ciprofloxacin (CIPRO) 500 MG tablet Take 1 tablet (500 mg total) by mouth every 12 (twelve) hours for 5 days. 10/10/17 10/15/17  Zigmund Gottron, NP  fluticasone (FLONASE) 50 MCG/ACT nasal spray Place 1 spray into both nostrils daily.    [provider]  ipratropium (ATROVENT HFA) 17 MCG/ACT  inhaler Inhale 2 puffs into the lungs every 6 (six) hours.    [provider]  levocetirizine (XYZAL) 5 MG tablet Take 5 mg by mouth every evening.    [provider]  risedronate (ACTONEL) 35 MG tablet Take 35 mg by mouth every 7 (seven) days. with water on empty stomach, nothing by mouth or lie down for next 30 minutes.    [provider]    Family History Family History  Problem Relation Age of Onset  . Alzheimer's disease Father     Social History Social History   Tobacco Use  . Smoking status: Never Smoker  . Smokeless tobacco: Never Used  Substance Use Topics  . Alcohol use: No  . Drug use: Not on file     Allergies   Penicillins and Sulfamethoxazole   Review of Systems Review of Systems   Physical Exam Triage Vital Signs ED Triage Vitals  Enc Vitals Group     BP 10/10/17 1039 129/70     Pulse Rate 10/10/17 1039 72     Resp 10/10/17 1039 18     Temp 10/10/17 1039 97.9 F (36.6 C)     Temp Source 10/10/17 1039 Oral     SpO2 10/10/17 1039 97 %     Weight --      Height --      Head Circumference --      Peak Flow --  Pain Score 10/10/17 1043 0     Pain Loc --      Pain Edu? --      Excl. in [place]? --    No data found.  Updated Vital Signs BP 129/70 (BP Location: Left Arm)   Pulse 72   Temp 97.9 F (36.6 C) (Oral)   Resp 18   SpO2 97%    Physical Exam  Constitutional: She is oriented to person, place, and time. She appears well-developed and well-nourished. No distress.  Cardiovascular: Normal rate, regular rhythm and normal heart sounds.  Pulmonary/Chest: Effort normal and breath sounds normal.  Abdominal: Soft. There is no tenderness. There is no rigidity, no rebound, no guarding, no CVA tenderness and negative Murphy's sign.  Neurological: She is alert and oriented to person, place, and time.  Skin: Skin is warm and dry.     UC Treatments / Results  Labs (all labs ordered are listed, but only abnormal results  are displayed) Labs Reviewed  POCT URINALYSIS DIP (DEVICE) - Abnormal; Notable for the following components:      Result Value   Hgb urine dipstick SMALL (*)    Leukocytes, UA TRACE (*)    All other components within normal limits  URINE CULTURE    EKG None  Radiology No results found.  Procedures Procedures (including critical care time)  Medications Ordered in UC Medications - No data to display  Initial Impression / Assessment and Plan / UC Course  I have reviewed the triage vital signs and the nursing notes.  Pertinent labs & imaging results that were available during my care of the patient were reviewed by me and considered in my medical decision making (see chart for details).     Afebrile. Non toxic. Trace leuks and small hgb to urine with symptoms of UTI. Cipro started, culture pending. Will notify if any changes to treatment are needed.  Return precautions provided. Patient verbalized understanding and agreeable to plan.    Final Clinical Impressions(s) / UC Diagnoses   Final diagnoses:  Complicated UTI (urinary tract infection)     Discharge Instructions     Complete course of antibiotics.   Drink plenty of water to empty bladder regularly. Avoid alcohol and caffeine as these may irritate the bladder.   I have sent your urine for culture and sensitivity. We will notify you if any results indicate a change needed in treatment.  If symptoms worsen or do not improve in the next week to return to be seen or to follow up with your PCP.     ED Prescriptions    Medication Sig Dispense Auth. Provider   ciprofloxacin (CIPRO) 500 MG tablet Take 1 tablet (500 mg total) by mouth every 12 (twelve) hours for 5 days. 10 tablet Zigmund Gottron, NP     Controlled Substance Prescriptions [place] Controlled Substance Registry consulted? Not Applicable   Zigmund Gottron, NP 10/10/17 1122

## 2017-10-10 NOTE — ED Triage Notes (Signed)
Pt presents with urinary tract symptoms 

## 2017-10-10 NOTE — ED Triage Notes (Signed)
Pt also states a little blood in urine

## 2017-10-11 LAB — URINE CULTURE

## 2017-10-20 ENCOUNTER — Ambulatory Visit (INDEPENDENT_AMBULATORY_CARE_PROVIDER_SITE_OTHER): Payer: Medicare Other | Admitting: Pulmonary Disease

## 2017-10-20 ENCOUNTER — Encounter: Payer: Self-pay | Admitting: Pulmonary Disease

## 2017-10-20 VITALS — BP 118/70 | HR 74 | Ht 68.0 in | Wt 153.2 lb

## 2017-10-20 DIAGNOSIS — G4733 Obstructive sleep apnea (adult) (pediatric): Secondary | ICD-10-CM

## 2017-10-20 NOTE — Patient Instructions (Signed)
Increase CPAP pressure to 8 cm and recheck download in 1 month.  We discussed care of CPAP machine. I will call sleep lab [phone] and asked them to change the report

## 2017-10-20 NOTE — Assessment & Plan Note (Addendum)
Increase CPAP pressure to 8 cm and recheck download in 1 month.  We discussed care of CPAP machine. I called sleep lab and asked them to change the report to reflect the actual AHI of 18/hour She has good compliance with CPAP is certainly helped improve her somnolence and fatigue  Advised against medications with sedative side effects Cautioned against driving when sleepy - understanding that sleepiness will vary on a day to day basis

## 2017-10-20 NOTE — Progress Notes (Signed)
   Subjective:    Patient ID: Veronica Snyder, female    DOB: 09/17/1945, 72 y.o.   MRN: [id number]  HPI  72 year old woman for follow-up of mild OSA. She was started on CPAP machine 07/2017 when she has had initially adjustment issues with nasal pillows but is now doing better.  She has noticed improvement in the daytime somnolence and fatigue.  Concentration is better and does not nap as much during the day. No problems with mask or pressure in fact she does not feel the air much. Download was reviewed which shows residual AHI 5.4/hour with average usage of 8 hours per night and no leak  Significant tests/ events reviewed  04/2017 NPSG >>During the baseline portion, total sleep time was 121 minutes with AHI of about 18/h with RDI of 32/h.  This was corrected by CPAP of 7 cm with nasal pillows.  Most of the time was spent in 6 cm, few central seem to emerge in this level.  Review of Systems Patient denies significant dyspnea,cough, hemoptysis,  chest pain, palpitations, pedal edema, orthopnea, paroxysmal nocturnal dyspnea, lightheadedness, nausea, vomiting, abdominal or  leg pains      Objective:   Physical Exam   Gen. Pleasant, well-nourished, in no distress ENT - no thrush, no post nasal drip Neck: No JVD, no thyromegaly, no carotid bruits Lungs: no use of accessory muscles, no dullness to percussion, clear without rales or rhonchi  Cardiovascular: Rhythm regular, heart sounds  normal, no murmurs or gallops, no peripheral edema Musculoskeletal: No deformities, no cyanosis or clubbing         Assessment & Plan:

## 2017-11-24 DIAGNOSIS — D225 Melanocytic nevi of trunk: Secondary | ICD-10-CM | POA: Diagnosis not present

## 2017-11-24 DIAGNOSIS — L821 Other seborrheic keratosis: Secondary | ICD-10-CM | POA: Diagnosis not present

## 2017-11-24 DIAGNOSIS — D2372 Other benign neoplasm of skin of left lower limb, including hip: Secondary | ICD-10-CM | POA: Diagnosis not present

## 2017-11-24 DIAGNOSIS — D2371 Other benign neoplasm of skin of right lower limb, including hip: Secondary | ICD-10-CM | POA: Diagnosis not present

## 2017-11-24 DIAGNOSIS — L814 Other melanin hyperpigmentation: Secondary | ICD-10-CM | POA: Diagnosis not present

## 2017-11-24 DIAGNOSIS — D235 Other benign neoplasm of skin of trunk: Secondary | ICD-10-CM | POA: Diagnosis not present

## 2017-11-24 DIAGNOSIS — Z85828 Personal history of other malignant neoplasm of skin: Secondary | ICD-10-CM | POA: Diagnosis not present

## 2017-11-24 DIAGNOSIS — D2261 Melanocytic nevi of right upper limb, including shoulder: Secondary | ICD-10-CM | POA: Diagnosis not present

## 2017-11-24 DIAGNOSIS — D2272 Melanocytic nevi of left lower limb, including hip: Secondary | ICD-10-CM | POA: Diagnosis not present

## 2017-12-06 ENCOUNTER — Other Ambulatory Visit (INDEPENDENT_AMBULATORY_CARE_PROVIDER_SITE_OTHER): Payer: Self-pay | Admitting: *Deleted

## 2017-12-06 DIAGNOSIS — Z1211 Encounter for screening for malignant neoplasm of colon: Secondary | ICD-10-CM

## 2017-12-07 DIAGNOSIS — R05 Cough: Secondary | ICD-10-CM | POA: Diagnosis not present

## 2017-12-07 DIAGNOSIS — J3089 Other allergic rhinitis: Secondary | ICD-10-CM | POA: Diagnosis not present

## 2017-12-07 DIAGNOSIS — J300 Vasomotor rhinitis: Secondary | ICD-10-CM | POA: Diagnosis not present

## 2017-12-21 NOTE — Procedures (Signed)
Vesta A. Merlene Laughter, MD     [email or web address]             CORRECTED REPORT NOCTURNAL POLYSOMNOGRAPHY   CORRECTED REPORT  LOCATION: [facility]   Patient Name: Veronica Snyder, Veronica Snyder Date: 04/27/2017 Gender: Female D.O.B: Jun 09, 1945 Age (years): 71 Referring Provider: Asencion Noble Height (inches): 60 Interpreting Physician: Phillips Odor MD, ABSM Weight (lbs): 150 RPSGT: Peak, Robert BMI: 23 MRN: [id number] Neck Size: 13.50 CLINICAL INFORMATION Sleep Study Type: Split Night CPAP  Indication for sleep study: Fatigue, Snoring  Epworth Sleepiness Score: 8  SLEEP STUDY TECHNIQUE As per the AASM Manual for the Scoring of Sleep and Associated Events v2.3 (April 2016) with a hypopnea requiring 4% desaturations.  The channels recorded and monitored were frontal, central and occipital EEG, electrooculogram (EOG), submentalis EMG (chin), nasal and oral airflow, thoracic and abdominal wall motion, anterior tibialis EMG, snore microphone, electrocardiogram, and pulse oximetry. Continuous positive airway pressure (CPAP) was initiated when the patient met split night criteria and was titrated according to treat sleep-disordered breathing.  MEDICATIONS Medications self-administered by patient taken the night of the study : N/A  Current Outpatient Medications:  .  fluticasone (FLONASE) 50 MCG/ACT nasal spray, Place 1 spray into both nostrils daily., Disp: , Rfl:  .  ipratropium (ATROVENT HFA) 17 MCG/ACT inhaler, Inhale 2 puffs into the lungs every 6 (six) hours., Disp: , Rfl:  .  levocetirizine (XYZAL) 5 MG tablet, Take 5 mg by mouth every evening., Disp: , Rfl:  .  risedronate (ACTONEL) 35 MG tablet, Take 35 mg by mouth every 7 (seven) days. with water on empty stomach, nothing by mouth or lie down for next 30 minutes., Disp: , Rfl:    RESPIRATORY PARAMETERS Diagnostic  Total AHI (/hr): 17.9 RDI (/hr): 32.2 OA Index (/hr): 5.5 CA Index (/hr): 0.0 REM AHI  (/hr): N/A NREM AHI (/hr): 17.9 Supine AHI (/hr): 31.4 Non-supine AHI (/hr): 2.1 Min O2 Sat (%): 88.0 Mean O2 (%): 93.5 Time below 88% (min): 0.4   Titration  Optimal Pressure (cm): 6 AHI at Optimal Pressure (/hr): 5.4 Min O2 at Optimal Pressure (%): 89.0 Supine % at Optimal (%): 6 Sleep % at Optimal (%): 91   SLEEP ARCHITECTURE The recording time for the entire night was 461.6 minutes.  During a baseline period of 240.4 minutes, the patient slept for 121.0 minutes in REM and nonREM, yielding a sleep efficiency of 50.3%%. Sleep onset after lights out was 32.3 minutes with a REM latency of N/A minutes. The patient spent 15.7%% of the night in stage N1 sleep, 83.5%% in stage N2 sleep, 0.8%% in stage N3 and 0% in REM.  During the titration period of 209.1 minutes, the patient slept for 134.8 minutes in REM and nonREM, yielding a sleep efficiency of 64.5%%. Sleep onset after CPAP initiation was 50.8 minutes with a REM latency of 88.5 minutes. The patient spent 12.6%% of the night in stage N1 sleep, 50.4%% in stage N2 sleep, 10.8%% in stage N3 and 26.2% in REM.  CARDIAC DATA The 2 lead EKG demonstrated sinus rhythm. The mean heart rate was 100.0 beats per minute. Other EKG findings include: None.   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    IMPRESSIONS 1. Moderate obstructive sleep apnea occurred during the diagnostic portion of the study(AHI = 17.9/hour). The optimal CPAP selected for this patient is ( 6 cm of water) 2. 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  Delano Metz, MD Diplomate, American Board of Sleep Medicine.   ELECTRONICALLY SIGNED ON:  12/21/2017, 9:05 AM [place] PH: [phone]   FX: [phone] [place]

## 2017-12-28 DIAGNOSIS — Z23 Encounter for immunization: Secondary | ICD-10-CM | POA: Diagnosis not present

## 2018-01-23 ENCOUNTER — Telehealth: Payer: Self-pay | Admitting: Orthopaedic Surgery

## 2018-01-23 ENCOUNTER — Other Ambulatory Visit (HOSPITAL_COMMUNITY): Payer: Self-pay | Admitting: Internal Medicine

## 2018-01-23 ENCOUNTER — Ambulatory Visit (HOSPITAL_COMMUNITY)
Admission: RE | Admit: 2018-01-23 | Discharge: 2018-01-23 | Disposition: A | Discharge status: Home / Self Care | Payer: Medicare Other | Source: Ambulatory Visit | Attending: Internal Medicine | Admitting: Internal Medicine

## 2018-01-23 DIAGNOSIS — X58XXXA Exposure to other specified factors, initial encounter: Secondary | ICD-10-CM | POA: Insufficient documentation

## 2018-01-23 DIAGNOSIS — S92354A Nondisplaced fracture of fifth metatarsal bone, right foot, initial encounter for closed fracture: Secondary | ICD-10-CM | POA: Diagnosis not present

## 2018-01-23 DIAGNOSIS — M79671 Pain in right foot: Secondary | ICD-10-CM | POA: Insufficient documentation

## 2018-01-23 NOTE — Telephone Encounter (Signed)
Call received from patient, asking if we have any openings today; states stepped funny onto right foot, and is concerned she may have fractured something. Relayed unfortunately no openings today or tomorrow, and will check on cancellations and let patient know if any received. Offered options of primary care, emergency room, or urgent care.

## 2018-01-26 NOTE — Telephone Encounter (Signed)
No further response. 

## 2018-02-06 ENCOUNTER — Telehealth (INDEPENDENT_AMBULATORY_CARE_PROVIDER_SITE_OTHER): Payer: Self-pay | Admitting: *Deleted

## 2018-02-06 ENCOUNTER — Encounter (INDEPENDENT_AMBULATORY_CARE_PROVIDER_SITE_OTHER): Payer: Self-pay | Admitting: *Deleted

## 2018-02-06 MED ORDER — PEG 3350-KCL-NA BICARB-NACL 420 G PO SOLR: 4000.0000 mL | Freq: Once | ORAL | 0 refills | Status: AC

## 2018-02-06 NOTE — Telephone Encounter (Signed)
Patient needs trilyte 

## 2018-02-07 ENCOUNTER — Ambulatory Visit (INDEPENDENT_AMBULATORY_CARE_PROVIDER_SITE_OTHER): Payer: Medicare Other

## 2018-02-07 ENCOUNTER — Encounter: Payer: Self-pay | Admitting: Podiatry

## 2018-02-07 ENCOUNTER — Other Ambulatory Visit: Payer: Self-pay | Admitting: Podiatry

## 2018-02-07 ENCOUNTER — Ambulatory Visit (INDEPENDENT_AMBULATORY_CARE_PROVIDER_SITE_OTHER): Payer: Medicare Other | Admitting: Podiatry

## 2018-02-07 DIAGNOSIS — Z8262 Family history of osteoporosis: Secondary | ICD-10-CM | POA: Diagnosis not present

## 2018-02-07 DIAGNOSIS — Z1231 Encounter for screening mammogram for malignant neoplasm of breast: Secondary | ICD-10-CM | POA: Diagnosis not present

## 2018-02-07 DIAGNOSIS — S92901A Unspecified fracture of right foot, initial encounter for closed fracture: Secondary | ICD-10-CM | POA: Diagnosis not present

## 2018-02-07 DIAGNOSIS — S92354A Nondisplaced fracture of fifth metatarsal bone, right foot, initial encounter for closed fracture: Secondary | ICD-10-CM

## 2018-02-07 DIAGNOSIS — M8589 Other specified disorders of bone density and structure, multiple sites: Secondary | ICD-10-CM | POA: Diagnosis not present

## 2018-02-07 DIAGNOSIS — S9031XA Contusion of right foot, initial encounter: Secondary | ICD-10-CM

## 2018-02-07 DIAGNOSIS — Z803 Family history of malignant neoplasm of breast: Secondary | ICD-10-CM | POA: Diagnosis not present

## 2018-02-07 DIAGNOSIS — M810 Age-related osteoporosis without current pathological fracture: Secondary | ICD-10-CM | POA: Diagnosis not present

## 2018-02-08 NOTE — Progress Notes (Signed)
  Subjective:  Patient ID: Veronica Snyder, female    DOB: 05-Jun-1945,  MRN: [id number] HPI Chief Complaint  Patient presents with  . Foot Injury    Patient states she was just walking and felt a pain on the side of her foot on Nov. 17th, went to PCP and they sent her for an xray at [facility], showed 5th met fracture, sent to medical supply store for surgical shoe, which she presents in today    72 y.o. female presents with the above complaint.   ROS: Denies fever chills nausea vomiting muscle aches pains calf pain back pain chest pain shortness of breath.  Past Medical History:  Diagnosis Date  . Palpitations    Short runs of SVT by event recording associated with patient's symptoms 2012   Past Surgical History:  Procedure Laterality Date  . COLONOSCOPY  2006   Negative  . IRRIGATION AND DEBRIDEMENT SEBACEOUS CYST  2005  . ORIF DISTAL RADIUS FRACTURE  2006   Right  . WISDOM TOOTH EXTRACTION      Current Outpatient Medications:  .  fluticasone (FLONASE) 50 MCG/ACT nasal spray, Place 1 spray into both nostrils daily., Disp: , Rfl:  .  ipratropium (ATROVENT HFA) 17 MCG/ACT inhaler, Inhale 2 puffs into the lungs every 6 (six) hours., Disp: , Rfl:  .  levocetirizine (XYZAL) 5 MG tablet, Take 5 mg by mouth every evening., Disp: , Rfl:  .  risedronate (ACTONEL) 35 MG tablet, Take 35 mg by mouth every 7 (seven) days. with water on empty stomach, nothing by mouth or lie down for next 30 minutes., Disp: , Rfl:   Review of Systems Objective:  There were no vitals filed for this visit.  General: Well developed, nourished, in no acute distress, alert and oriented x3   Dermatological: Skin is warm, dry and supple bilateral. Nails x 10 are well maintained; remaining integument appears unremarkable at this time. There are no open sores, no preulcerative lesions, no rash or signs of infection present.  Vascular: Dorsalis Pedis artery and Posterior Tibial artery pedal pulses are 2/4  bilateral with immedate capillary fill time. Pedal hair growth present. No varicosities and no lower extremity edema present bilateral.   Neruologic: Grossly intact via light touch bilateral. Vibratory intact via tuning fork bilateral. Protective threshold with Semmes Wienstein monofilament intact to all pedal sites bilateral. Patellar and Achilles deep tendon reflexes 2+ bilateral. No Babinski or clonus noted bilateral.   Musculoskeletal: No gross boney pedal deformities bilateral. No pain, crepitus, or limitation noted with foot and ankle range of motion bilateral. Muscular strength 5/5 in all groups tested bilateral.  Pain is mild swelling to the fifth metatarsal base of the right foot with mild patch of overlying erythema.  This correlates with the fracture site.  Gait: Unassisted, Nonantalgic.    Radiographs:  Radiographs taken today in the office demonstrate a Jones fracture nondisplaced non-comminuted right fifth metatarsal.  Assessment & Plan:   Assessment: Jones fracture good position should go on to heal on its own fifth met base right.  Plan: Placed her in a cam boot long discussion of the do's and don'ts associated with providing the best healing environment.  She understands this is amenable to it we will follow-up with me in about 4 weeks for another x-ray.      T. [place], [place]

## 2018-02-14 ENCOUNTER — Telehealth (INDEPENDENT_AMBULATORY_CARE_PROVIDER_SITE_OTHER): Payer: Self-pay | Admitting: *Deleted

## 2018-02-14 NOTE — Telephone Encounter (Signed)
Referring MD/PCP: fagan   Procedure: tcs  Reason/Indication: screening  Has patient had this procedure before? Yes, 2006 If so, when, by whom and where?   Is there a family history of colon cancer? no Who? What age when diagnosed?   Is patient diabetic? no  Does patient have prosthetic heart valve or mechanical valve? no  Do you have a pacemaker? no  Has patient ever had endocarditis? no  Has patient had joint replacement within last 12 months? no  Does patient tend to be constipated or take laxatives? no  Does patient have a history of alcohol/drug use? no  Is patient on Coumadin, Plavix and/or Aspirin? no  Medications: desloratadine 5 mg daily, nasacort daily, vit d3 1000 iu daily, MVI daily, risedrinate 35 mg 1 tab weekly, elderberry prn  Allergies: pcn, sulfur  Medication Adjustment per Dr Laural Golden:   Procedure date &time: 03/15/18 at 830

## 2018-02-15 NOTE — Telephone Encounter (Signed)
agree

## 2018-02-22 DIAGNOSIS — Z01419 Encounter for gynecological examination (general) (routine) without abnormal findings: Secondary | ICD-10-CM | POA: Diagnosis not present

## 2018-02-22 DIAGNOSIS — Z124 Encounter for screening for malignant neoplasm of cervix: Secondary | ICD-10-CM | POA: Diagnosis not present

## 2018-03-15 ENCOUNTER — Encounter (HOSPITAL_COMMUNITY): Payer: Self-pay | Admitting: *Deleted

## 2018-03-15 ENCOUNTER — Ambulatory Visit (HOSPITAL_COMMUNITY)
Admission: RE | Admit: 2018-03-15 | Discharge: 2018-03-15 | Disposition: A | Discharge status: Home / Self Care | Payer: Medicare Other | Attending: Internal Medicine | Admitting: Internal Medicine

## 2018-03-15 ENCOUNTER — Encounter (HOSPITAL_COMMUNITY)
Admission: RE | Disposition: A | Discharge status: Home / Self Care | Payer: Self-pay | Source: Home / Self Care | Attending: Internal Medicine

## 2018-03-15 ENCOUNTER — Other Ambulatory Visit: Payer: Self-pay

## 2018-03-15 DIAGNOSIS — K5730 Diverticulosis of large intestine without perforation or abscess without bleeding: Secondary | ICD-10-CM | POA: Insufficient documentation

## 2018-03-15 DIAGNOSIS — Z1211 Encounter for screening for malignant neoplasm of colon: Secondary | ICD-10-CM

## 2018-03-15 DIAGNOSIS — Z79899 Other long term (current) drug therapy: Secondary | ICD-10-CM | POA: Insufficient documentation

## 2018-03-15 DIAGNOSIS — Z880 Allergy status to penicillin: Secondary | ICD-10-CM | POA: Diagnosis not present

## 2018-03-15 DIAGNOSIS — K644 Residual hemorrhoidal skin tags: Secondary | ICD-10-CM

## 2018-03-15 DIAGNOSIS — Z882 Allergy status to sulfonamides status: Secondary | ICD-10-CM | POA: Insufficient documentation

## 2018-03-15 DIAGNOSIS — Z7951 Long term (current) use of inhaled steroids: Secondary | ICD-10-CM | POA: Diagnosis not present

## 2018-03-15 HISTORY — PX: COLONOSCOPY: SHX5424

## 2018-03-15 SURGERY — COLONOSCOPY
Anesthesia: Moderate Sedation

## 2018-03-15 MED ORDER — MIDAZOLAM HCL 5 MG/5ML IJ SOLN
INTRAMUSCULAR | Status: AC
  Filled 2018-03-15: qty 10

## 2018-03-15 MED ORDER — SODIUM CHLORIDE 0.9 % IV SOLN: INTRAVENOUS | Status: DC

## 2018-03-15 MED ORDER — STERILE WATER FOR IRRIGATION IR SOLN
Status: DC | PRN
  Administered 2018-03-15: 09:00:00

## 2018-03-15 MED ORDER — MEPERIDINE HCL 50 MG/ML IJ SOLN
INTRAMUSCULAR | Status: AC
  Filled 2018-03-15: qty 1

## 2018-03-15 MED ORDER — MIDAZOLAM HCL 5 MG/5ML IJ SOLN
INTRAMUSCULAR | Status: DC | PRN
  Administered 2018-03-15: 2 mg via INTRAVENOUS
  Administered 2018-03-15 (×2): 1 mg via INTRAVENOUS
  Administered 2018-03-15: 2 mg via INTRAVENOUS
  Administered 2018-03-15 (×2): 1 mg via INTRAVENOUS

## 2018-03-15 MED ORDER — MEPERIDINE HCL 50 MG/ML IJ SOLN
INTRAMUSCULAR | Status: DC | PRN
  Administered 2018-03-15 (×2): 25 mg

## 2018-03-15 NOTE — Discharge Instructions (Signed)
Resume usual medications as before. High-fiber diet. No driving for 24 hours.   Colonoscopy, Adult, Care After This sheet gives you information about how to care for yourself after your procedure. Your doctor may also give you more specific instructions. If you have problems or questions, call your doctor. What can I expect after the procedure? After the procedure, it is common to have:  A small amount of blood in your poop for 24 hours.  Some gas.  Mild cramping or bloating in your belly. Follow these instructions at home: General instructions  For the first 24 hours after the procedure: ? Do not drive or use machinery. ? Do not sign important documents. ? Do not drink alcohol. ? Do your daily activities more slowly than normal. ? Eat foods that are soft and easy to digest.  Take over-the-counter or prescription medicines only as told by your doctor. To help cramping and bloating:   Try walking around.  Put heat on your belly (abdomen) as told by your doctor. Use a heat source that your doctor recommends, such as a moist heat pack or a heating pad. ? Put a towel between your skin and the heat source. ? Leave the heat on for 20-30 minutes. ? Remove the heat if your skin turns bright red. This is especially important if you cannot feel pain, heat, or cold. You can get burned. Eating and drinking   Drink enough fluid to keep your pee (urine) clear or pale yellow.  Return to your normal diet as told by your doctor. Avoid heavy or fried foods that are hard to digest.  Avoid drinking alcohol for as long as told by your doctor. Contact a doctor if:  You have blood in your poop (stool) 2-3 days after the procedure. Get help right away if:  You have more than a small amount of blood in your poop.  You see large clumps of tissue (blood clots) in your poop.  Your belly is swollen.  You feel sick to your stomach (nauseous).  You throw up (vomit).  You have a  fever.  You have belly pain that gets worse, and medicine does not help your pain. Summary  After the procedure, it is common to have a small amount of blood in your poop. You may also have mild cramping and bloating in your belly.  For the first 24 hours after the procedure, do not drive or use machinery, do not sign important documents, and do not drink alcohol.  Get help right away if you have a lot of blood in your poop, feel sick to your stomach, have a fever, or have more belly pain. This information is not intended to replace advice given to you by your health care provider. Make sure you discuss any questions you have with your health care provider. Document Released: 03/27/2010 Document Revised: 12/23/2016 Document Reviewed: 11/17/2015 Elsevier Interactive Patient Education  2019 [employer].  Diverticulosis  Diverticulosis is a condition that develops when small pouches (diverticula) form in the wall of the large intestine (colon). The colon is where water is absorbed and stool is formed. The pouches form when the inside layer of the colon pushes through weak spots in the outer layers of the colon. You may have a few pouches or many of them. What are the causes? The cause of this condition is not known. What increases the risk? The following factors may make you more likely to develop this condition:  Being older than age 69.  Your risk for this condition increases with age. Diverticulosis is rare among people younger than age 86. By age 52, many people have it.  Eating a low-fiber diet.  Having frequent constipation.  Being overweight.  Not getting enough exercise.  Smoking.  Taking over-the-counter pain medicines, like aspirin and ibuprofen.  Having a family history of diverticulosis. What are the signs or symptoms? In most people, there are no symptoms of this condition. If you do have symptoms, they may include:  Bloating.  Cramps in the  abdomen.  Constipation or diarrhea.  Pain in the lower left side of the abdomen. How is this diagnosed? This condition is most often diagnosed during an exam for other colon problems. Because diverticulosis usually has no symptoms, it often cannot be diagnosed independently. This condition may be diagnosed by:  Using a flexible scope to examine the colon (colonoscopy).  Taking an X-ray of the colon after dye has been put into the colon (barium enema).  Doing a CT scan. How is this treated? You may not need treatment for this condition if you have never developed an infection related to diverticulosis. If you have had an infection before, treatment may include:  Eating a high-fiber diet. This may include eating more fruits, vegetables, and grains.  Taking a fiber supplement.  Taking a live bacteria supplement (probiotic).  Taking medicine to relax your colon.  Taking antibiotic medicines. Follow these instructions at home:  Drink 6-8 glasses of water or more each day to prevent constipation.  Try not to strain when you have a bowel movement.  If you have had an infection before: ? Eat more fiber as directed by your health care provider or your diet and nutrition specialist (dietitian). ? Take a fiber supplement or probiotic, if your health care provider approves.  Take over-the-counter and prescription medicines only as told by your health care provider.  If you were prescribed an antibiotic, take it as told by your health care provider. Do not stop taking the antibiotic even if you start to feel better.  Keep all follow-up visits as told by your health care provider. This is important. Contact a health care provider if:  You have pain in your abdomen.  You have bloating.  You have cramps.  You have not had a bowel movement in 3 days. Get help right away if:  Your pain gets worse.  Your bloating becomes very bad.  You have a fever or chills, and your symptoms  suddenly get worse.  You vomit.  You have bowel movements that are bloody or black.  You have bleeding from your rectum. Summary  Diverticulosis is a condition that develops when small pouches (diverticula) form in the wall of the large intestine (colon).  You may have a few pouches or many of them.  This condition is most often diagnosed during an exam for other colon problems.  If you have had an infection related to diverticulosis, treatment may include increasing the fiber in your diet, taking supplements, or taking medicines. This information is not intended to replace advice given to you by your health care provider. Make sure you discuss any questions you have with your health care provider. Document Released: 11/20/2003 Document Revised: 01/12/2016 Document Reviewed: 01/12/2016 Elsevier Interactive Patient Education  2019 [employer].  Hemorrhoids Hemorrhoids are swollen veins in and around the rectum or anus. There are two types of hemorrhoids:  Internal hemorrhoids. These occur in the veins that are just inside the rectum. They may  poke through to the outside and become irritated and painful.  External hemorrhoids. These occur in the veins that are outside the anus and can be felt as a painful swelling or hard lump near the anus. Most hemorrhoids do not cause serious problems, and they can be managed with home treatments such as diet and lifestyle changes. If home treatments do not help the symptoms, procedures can be done to shrink or remove the hemorrhoids. What are the causes? This condition is caused by increased pressure in the anal area. This pressure may result from various things, including:  Constipation.  Straining to have a bowel movement.  Diarrhea.  Pregnancy.  Obesity.  Sitting for long periods of time.  Heavy lifting or other activity that causes you to strain.  Anal sex.  Riding a bike for a long period of time. What are the signs or  symptoms? Symptoms of this condition include:  Pain.  Anal itching or irritation.  Rectal bleeding.  Leakage of stool (feces).  Anal swelling.  One or more lumps around the anus. How is this diagnosed? This condition can often be diagnosed through a visual exam. Other exams or tests may also be done, such as:  An exam that involves feeling the rectal area with a gloved hand (digital rectal exam).  An exam of the anal canal that is done using a small tube (anoscope).  A blood test, if you have lost a significant amount of blood.  A test to look inside the colon using a flexible tube with a camera on the end (sigmoidoscopy or colonoscopy). How is this treated? This condition can usually be treated at home. However, various procedures may be done if dietary changes, lifestyle changes, and other home treatments do not help your symptoms. These procedures can help make the hemorrhoids smaller or remove them completely. Some of these procedures involve surgery, and others do not. Common procedures include:  Rubber band ligation. Rubber bands are placed at the base of the hemorrhoids to cut off their blood supply.  Sclerotherapy. Medicine is injected into the hemorrhoids to shrink them.  Infrared coagulation. A type of light energy is used to get rid of the hemorrhoids.  Hemorrhoidectomy surgery. The hemorrhoids are surgically removed, and the veins that supply them are tied off.  Stapled hemorrhoidopexy surgery. The surgeon staples the base of the hemorrhoid to the rectal wall. Follow these instructions at home: Eating and drinking   Eat foods that have a lot of fiber in them, such as whole grains, beans, nuts, fruits, and vegetables.  Ask your health care provider about taking products that have added fiber (fiber supplements).  Reduce the amount of fat in your diet. You can do this by eating low-fat dairy products, eating less red meat, and avoiding processed foods.  Drink  enough fluid to keep your urine pale yellow. Managing pain and swelling   Take warm sitz baths for 20 minutes, 3-4 times a day to ease pain and discomfort. You may do this in a bathtub or using a portable sitz bath that fits over the toilet.  If directed, apply ice to the affected area. Using ice packs between sitz baths may be helpful. ? Put ice in a plastic bag. ? Place a towel between your skin and the bag. ? Leave the ice on for 20 minutes, 2-3 times a day. General instructions  Take over-the-counter and prescription medicines only as told by your health care provider.  Use medicated creams or suppositories as  told.  Get regular exercise. Ask your health care provider how much and what kind of exercise is best for you. In general, you should do moderate exercise for at least 30 minutes on most days of the week (150 minutes each week). This can include activities such as walking, biking, or yoga.  Go to the bathroom when you have the urge to have a bowel movement. Do not wait.  Avoid straining to have bowel movements.  Keep the anal area dry and clean. Use wet toilet paper or moist towelettes after a bowel movement.  Do not sit on the toilet for long periods of time. This increases blood pooling and pain.  Keep all follow-up visits as told by your health care provider. This is important. Contact a health care provider if you have:  Increasing pain and swelling that are not controlled by treatment or medicine.  Difficulty having a bowel movement, or you are unable to have a bowel movement.  Pain or inflammation outside the area of the hemorrhoids. Get help right away if you have:  Uncontrolled bleeding from your rectum. Summary  Hemorrhoids are swollen veins in and around the rectum or anus.  Most hemorrhoids can be managed with home treatments such as diet and lifestyle changes.  Taking warm sitz baths can help ease pain and discomfort.  In severe cases, procedures or  surgery can be done to shrink or remove the hemorrhoids. This information is not intended to replace advice given to you by your health care provider. Make sure you discuss any questions you have with your health care provider. Document Released: 02/20/2000 Document Revised: 07/14/2017 Document Reviewed: 07/14/2017 Elsevier Interactive Patient Education  2019 [employer].

## 2018-03-15 NOTE — H&P (Signed)
Veronica Snyder is an 73 y.o. female.   Chief Complaint: Patient is here for colonoscopy. HPI: Patient 73 year old Caucasian female who is here for screening colonoscopy.  Last exam was normal other than external hemorrhoids back in June 2006.  She denies abdominal pain change in bowel habits or rectal bleeding. She does not take aspirin or other NSAIDs or anticoagulants. Family history is negative for CRC.  Past Medical History:  Diagnosis Date  . Palpitations    Short runs of SVT by event recording associated with patient's symptoms 2012    Past Surgical History:  Procedure Laterality Date  . COLONOSCOPY  2006   Negative  . IRRIGATION AND DEBRIDEMENT SEBACEOUS CYST  2005  . ORIF DISTAL RADIUS FRACTURE  2006   Right  . WISDOM TOOTH EXTRACTION      Family History  Problem Relation Age of Onset  . Alzheimer's disease Father    Social History:  reports that she has never smoked. She has never used smokeless tobacco. She reports that she does not drink alcohol or use drugs.  Allergies:  Allergies  Allergen Reactions  . Penicillins Rash      . Sulfamethoxazole Itching and Rash    Medications Prior to Admission  Medication Sig Dispense Refill  . Black Elderberry (SAMBUCUS ELDERBERRY) 50 MG/5ML SYRP Take 10-20 mg by mouth daily.    . Calcium Carb-Cholecalciferol (CALCIUM 600 + D PO) Take 1 tablet by mouth daily.    . cholecalciferol (VITAMIN D3) 25 MCG (1000 UT) tablet Take 1,000 Units by mouth daily.    . fluticasone (FLONASE) 50 MCG/ACT nasal spray Place 1 spray into both nostrils daily as needed for allergies.     Marland Kitchen levocetirizine (XYZAL) 5 MG tablet Take 5 mg by mouth every evening.    . pseudoephedrine (SUDAFED) 30 MG tablet Take 30 mg by mouth every 4 (four) hours as needed for congestion.    . risedronate (ACTONEL) 35 MG tablet Take 35 mg by mouth every 7 (seven) days. with water on empty stomach, nothing by mouth or lie down for next 30 minutes.    Marland Kitchen ipratropium  (ATROVENT) 0.03 % nasal spray Place 2 sprays into both nostrils daily as needed for rhinitis.      No results found for this or any previous visit (from the past 48 hour(s)). No results found.  ROS  Blood pressure (!) 115/59, pulse 80, temperature 97.9 F (36.6 C), temperature source Oral, resp. rate 13, height 5' 8.5" (1.74 m), weight 68.9 kg, SpO2 100 %. Physical Exam  Constitutional: She appears well-developed and well-nourished.  HENT:  Mouth/Throat: Oropharynx is clear and moist.  Eyes: Conjunctivae are normal. No scleral icterus.  Neck: No thyromegaly present.  Cardiovascular: Normal rate, regular rhythm and normal heart sounds.  No murmur heard. Respiratory: Effort normal and breath sounds normal.  GI: Soft. She exhibits no distension and no mass. There is no abdominal tenderness.  Musculoskeletal:        General: No edema.  Lymphadenopathy:    She has no cervical adenopathy.  Neurological: She is alert.  Skin: Skin is warm and dry.     Assessment/Plan Average rescreening colonoscopy.  Hildred Laser, MD 03/15/2018, 8:12 AM

## 2018-03-15 NOTE — Op Note (Signed)
[facility] Patient Name: Veronica Snyder Procedure Date: 03/15/2018 7:51 AM MRN: [id number] Date of Birth: 06-25-1945 Attending MD: Hildred Laser , MD CSN: [id number] Age: 73 Admit Type: Outpatient Procedure:                Colonoscopy Indications:              Screening for colorectal malignant neoplasm Providers:                Hildred Laser, MD, Janeece Riggers, RN, Aram Candela Referring MD:             Asencion Noble, MD Medicines:                Meperidine 50 mg IV, Midazolam 8 mg IV Complications:            No immediate complications. Estimated Blood Loss:     Estimated blood loss: none. Procedure:                Pre-Anesthesia Assessment:                           - Prior to the procedure, a History and Physical                            was performed, and patient medications and                            allergies were reviewed. The patient's tolerance of                            previous anesthesia was also reviewed. The risks                            and benefits of the procedure and the sedation                            options and risks were discussed with the patient.                            All questions were answered, and informed consent                            was obtained. Prior Anticoagulants: The patient has                            taken no previous anticoagulant or antiplatelet                            agents. ASA Grade Assessment: I - A normal, healthy                            patient. After reviewing the risks and benefits,                            the patient was deemed in satisfactory condition to  undergo the procedure.                           After obtaining informed consent, the colonoscope                            was passed under direct vision. Throughout the                            procedure, the patient's blood pressure, pulse, and                            oxygen saturations were monitored  continuously. The                            PCF-[id number] ([id number]) was introduced through the                            anus and advanced to the the cecum, identified by                            appendiceal orifice and ileocecal valve. The                            colonoscopy was performed without difficulty. The                            patient tolerated the procedure well. The quality                            of the bowel preparation was excellent. The                            ileocecal valve, appendiceal orifice, and rectum                            were photographed. Scope In: 8:26:43 AM Scope Out: 8:44:18 AM Scope Withdrawal Time: 0 hours 7 minutes 3 seconds  Total Procedure Duration: 0 hours 17 minutes 35 seconds  Findings:      The perianal and digital rectal examinations were normal.      A single medium-mouthed diverticulum was found in the hepatic flexure.      Two medium-mouthed diverticula were found in the sigmoid colon and       distal sigmoid colon.      The exam was otherwise normal throughout the examined colon.      External hemorrhoids were found during retroflexion. The hemorrhoids       were small. Impression:               - Diverticulosis at the hepatic flexure.                           - Diverticulosis in the sigmoid colon and in the  distal sigmoid colon.                           - External hemorrhoids.                           - No specimens collected. Moderate Sedation:      Moderate (conscious) sedation was administered by the endoscopy nurse       and supervised by the endoscopist. The following parameters were       monitored: oxygen saturation, heart rate, blood pressure, CO2       capnography and response to care. Total physician intraservice time was       28 minutes. Recommendation:           - Patient has a contact number available for                            emergencies. The signs and symptoms of potential                             delayed complications were discussed with the                            patient. Return to normal activities tomorrow.                            Written discharge instructions were provided to the                            patient.                           - High fiber diet today.                           - Continue present medications.                           - No repeat colonoscopy due to current age (4                            years or older) and the absence of advanced                            adenomas. Procedure Code(s):        --- Professional ---                           [phone], Colonoscopy, flexible; diagnostic, including                            collection of specimen(s) by brushing or washing,                            when performed (separate procedure)  57017, Moderate sedation; each additional 15                            minutes intraservice time                           G0500, Moderate sedation services provided by the                            same physician or other qualified health care                            professional performing a gastrointestinal                            endoscopic service that sedation supports,                            requiring the presence of an independent trained                            observer to assist in the monitoring of the                            patient's level of consciousness and physiological                            status; initial 15 minutes of intra-service time;                            patient age 19 years or older (additional time may                            be reported with [phone], as appropriate) Diagnosis Code(s):        --- Professional ---                           Z12.11, Encounter for screening for malignant                            neoplasm of colon                           K64.4, Residual hemorrhoidal skin tags                            K57.30, Diverticulosis of large intestine without                            perforation or abscess without bleeding CPT copyright 2018 [employer]. All rights reserved. The codes documented in this report are preliminary and upon coder review may  be revised to meet current compliance requirements. Hildred Laser, MD Hildred Laser, MD 03/15/2018 8:51:57 AM This report has been signed electronically. Number of Addenda: 0

## 2018-03-16 ENCOUNTER — Encounter: Payer: Self-pay | Admitting: Podiatry

## 2018-03-16 ENCOUNTER — Ambulatory Visit (INDEPENDENT_AMBULATORY_CARE_PROVIDER_SITE_OTHER): Payer: Medicare Other | Admitting: Podiatry

## 2018-03-16 ENCOUNTER — Ambulatory Visit (INDEPENDENT_AMBULATORY_CARE_PROVIDER_SITE_OTHER): Payer: Medicare Other

## 2018-03-16 DIAGNOSIS — S92354D Nondisplaced fracture of fifth metatarsal bone, right foot, subsequent encounter for fracture with routine healing: Secondary | ICD-10-CM

## 2018-03-16 NOTE — Progress Notes (Signed)
She presents today for follow-up of her Jones fracture right foot.  States that it has felt good I hope that it is healing up not put any weight on left foot without the boot on.  Objective: Vital signs are stable alert and oriented x3.  There is no erythema edema cellulitis drainage or odor radiographs taken today demonstrate healing of the dorsal aspect of the fracture site plantar aspect of the fracture site is undergoing some remodeling.  There is no increase in deformity.  Assessment: Well-healing fracture.  Plan: Continue use of the cam walker for another month follow-up with her at that time.

## 2018-03-17 ENCOUNTER — Encounter (HOSPITAL_COMMUNITY): Payer: Self-pay | Admitting: Family Medicine

## 2018-03-17 ENCOUNTER — Ambulatory Visit (INDEPENDENT_AMBULATORY_CARE_PROVIDER_SITE_OTHER): Payer: Medicare Other

## 2018-03-17 ENCOUNTER — Ambulatory Visit (HOSPITAL_COMMUNITY)
Admission: EM | Admit: 2018-03-17 | Discharge: 2018-03-17 | Disposition: A | Discharge status: Home / Self Care | Payer: Medicare Other | Attending: Family Medicine | Admitting: Family Medicine

## 2018-03-17 DIAGNOSIS — H1033 Unspecified acute conjunctivitis, bilateral: Secondary | ICD-10-CM | POA: Diagnosis not present

## 2018-03-17 DIAGNOSIS — J320 Chronic maxillary sinusitis: Secondary | ICD-10-CM | POA: Diagnosis not present

## 2018-03-17 DIAGNOSIS — R05 Cough: Secondary | ICD-10-CM | POA: Diagnosis not present

## 2018-03-17 MED ORDER — CIPROFLOXACIN HCL 500 MG PO TABS: 500.0000 mg | ORAL_TABLET | Freq: Two times a day (BID) | ORAL | 0 refills | Status: DC

## 2018-03-17 MED ORDER — TOBRAMYCIN 0.3 % OP SOLN: 1.0000 [drp] | Freq: Four times a day (QID) | OPHTHALMIC | 0 refills | Status: DC

## 2018-03-17 MED ORDER — PREDNISONE 50 MG PO TABS: ORAL_TABLET | ORAL | 0 refills | Status: DC

## 2018-03-17 NOTE — Discharge Instructions (Addendum)
The eye drops and prednisone are for 5 days.  Avoid use of contacts for 3 days.  Wash the pillow cases.  Expect significant improvement in symptoms by Monday  Take the prednisone in the morning.

## 2018-03-17 NOTE — ED Triage Notes (Signed)
Pt c/o dry cough and hoarseness since before christmas off and on. States the cough is worse in the morning

## 2018-03-17 NOTE — ED Notes (Signed)
Bed: [facility]08 Expected date:  Expected time:  Means of arrival:  Comments: Hold for appt

## 2018-03-17 NOTE — ED Provider Notes (Signed)
[facility]    CSN: [id number] Arrival date & time: 03/17/18  1216     History   Chief Complaint Chief Complaint  Patient presents with  . Cough    HPI Veronica Snyder is a 73 y.o. female.   HPI  Past Medical History:  Diagnosis Date  . Palpitations    Short runs of SVT by event recording associated with patient's symptoms 2012    Patient Active Problem List   Diagnosis Date Noted  . OSA (obstructive sleep apnea) 07/19/2017  . Special screening for malignant neoplasms, colon 05/21/2016  . Abnormal weight gain 07/28/2015  . 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 Pain in joint, shoulder region 05/03/2012  . Impingement syndrome of left shoulder 05/03/2012  . 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 PALPITATIONS 03/13/2010    Past Surgical History:  Procedure Laterality Date  . COLONOSCOPY  2006   Negative  . IRRIGATION AND DEBRIDEMENT SEBACEOUS CYST  2005  . ORIF DISTAL RADIUS FRACTURE  2006   Right  . WISDOM TOOTH EXTRACTION      OB History   No obstetric history on file.      Home Medications    Prior to Admission medications   Medication Sig Start Date End Date Taking? Authorizing Provider  Black Elderberry (SAMBUCUS ELDERBERRY) 50 MG/5ML SYRP Take 10-20 mg by mouth daily.    [provider]  Calcium Carb-Cholecalciferol (CALCIUM 600 + D PO) Take 1 tablet by mouth daily.    [provider]  cholecalciferol (VITAMIN D3) 25 MCG (1000 UT) tablet Take 1,000 Units by mouth daily.    [provider]  ciprofloxacin (CIPRO) 500 MG tablet Take 1 tablet (500 mg total) by mouth 2 (two) times daily. 03/17/18   Robyn Haber, MD  fluticasone (FLONASE) 50 MCG/ACT nasal spray Place 1 spray into both nostrils daily as needed for allergies.     [provider]  ipratropium (ATROVENT) 0.03 % nasal spray Place 2 sprays into both nostrils daily as needed for rhinitis.    [provider]  levocetirizine (XYZAL) 5  MG tablet Take 5 mg by mouth every evening.    [provider]  predniSONE (DELTASONE) 50 MG tablet One daily with food 03/17/18   Robyn Haber, MD  pseudoephedrine (SUDAFED) 30 MG tablet Take 30 mg by mouth every 4 (four) hours as needed for congestion.    [provider]  risedronate (ACTONEL) 35 MG tablet Take 35 mg by mouth every 7 (seven) days. with water on empty stomach, nothing by mouth or lie down for next 30 minutes.    [provider]  tobramycin (TOBREX) 0.3 % ophthalmic solution Place 1 drop into both eyes every 6 (six) hours. 03/17/18   Robyn Haber, MD    Family History Family History  Problem Relation Age of Onset  . Alzheimer's disease Father     Social History Social History   Tobacco Use  . Smoking status: Never Smoker  . Smokeless tobacco: Never Used  Substance Use Topics  . Alcohol use: No  . Drug use: Never     Allergies   Penicillins and Sulfamethoxazole   Review of Systems Review of Systems   Physical Exam Triage Vital Signs ED Triage Vitals  Enc Vitals Group     BP 03/17/18 1231 125/65     Pulse Rate 03/17/18 1231 71     Resp 03/17/18 1231 20     Temp 03/17/18 1231 98.3 F (36.8 C)  Temp Source 03/17/18 1231 Oral     SpO2 03/17/18 1231 99 %     Weight --      Height --      Head Circumference --      Peak Flow --      Pain Score 03/17/18 1232 0     Pain Loc --      Pain Edu? --      Excl. in [place]? --    No data found.  Updated Vital Signs BP 125/65 (BP Location: Left Arm)   Pulse 71   Temp 98.3 F (36.8 C) (Oral)   Resp 20   SpO2 99%    Physical Exam Vitals signs and nursing note reviewed.  Constitutional:      Appearance: Normal appearance. She is normal weight.  HENT:     Head: Normocephalic.     Right Ear: Tympanic membrane and external ear normal.     Left Ear: Tympanic membrane and external ear normal.     Nose: Congestion present.     Mouth/Throat:     Mouth: Mucous membranes are  moist.     Pharynx: Oropharynx is clear.  Eyes:     Comments: Bilateral conjunctival injection  Neck:     Musculoskeletal: Normal range of motion and neck supple.  Cardiovascular:     Rate and Rhythm: Normal rate and regular rhythm.     Heart sounds: Normal heart sounds.  Pulmonary:     Effort: Pulmonary effort is normal.     Breath sounds: Normal breath sounds.  Musculoskeletal: Normal range of motion.     Comments: Wearing short cam walker on right lower extremity for Jones fracture  Skin:    General: Skin is warm and dry.  Neurological:     General: No focal deficit present.     Mental Status: She is alert and oriented to person, place, and time.      UC Treatments / Results  Labs (all labs ordered are listed, but only abnormal results are displayed) Labs Reviewed - No data to display  EKG None  Radiology Dg Chest 2 View  Result Date: 03/17/2018 CLINICAL DATA:  Productive cough for 3 months. EXAM: CHEST - 2 VIEW COMPARISON:  CT scan of the chest dated 09/26/2015 FINDINGS: The heart size and pulmonary vascularity are normal. There are no infiltrates or effusions. Tiny area of scarring laterally in the right lower lobe as demonstrated on the prior CT scan. Bones appear normal. IMPRESSION: No active cardiopulmonary disease. Electronically Signed   By: Lorriane Shire M.D.   On: 03/17/2018 13:00   Dg Foot Complete Right  Result Date: 03/16/2018 Please see detailed radiograph report in office note.   Procedures Procedures (including critical care time)  Medications Ordered in UC Medications - No data to display  Initial Impression / Assessment and Plan / UC Course  I have reviewed the triage vital signs and the nursing notes.  Pertinent labs & imaging results that were available during my care of the patient were reviewed by me and considered in my medical decision making (see chart for details).    Final Clinical Impressions(s) / UC Diagnoses   Final diagnoses:    Chronic maxillary sinusitis  Acute bacterial conjunctivitis of both eyes     Discharge Instructions     The eye drops and prednisone are for 5 days.  Avoid use of contacts for 3 days.  Wash the pillow cases.  Expect significant improvement in symptoms by Monday  Take the prednisone in the morning.    ED Prescriptions    Medication Sig Dispense Auth. Provider   predniSONE (DELTASONE) 50 MG tablet One daily with food 5 tablet Robyn Haber, MD   ciprofloxacin (CIPRO) 500 MG tablet Take 1 tablet (500 mg total) by mouth 2 (two) times daily. 20 tablet Robyn Haber, MD   tobramycin (TOBREX) 0.3 % ophthalmic solution Place 1 drop into both eyes every 6 (six) hours. 5 mL Robyn Haber, MD     Controlled Substance Prescriptions [place] Controlled Substance Registry consulted? Not Applicable   Robyn Haber, MD 03/17/18 1327

## 2018-03-20 ENCOUNTER — Encounter (HOSPITAL_COMMUNITY): Payer: Self-pay | Admitting: Internal Medicine

## 2018-04-03 DIAGNOSIS — M810 Age-related osteoporosis without current pathological fracture: Secondary | ICD-10-CM | POA: Diagnosis not present

## 2018-04-03 DIAGNOSIS — Z79899 Other long term (current) drug therapy: Secondary | ICD-10-CM | POA: Diagnosis not present

## 2018-04-03 DIAGNOSIS — F419 Anxiety disorder, unspecified: Secondary | ICD-10-CM | POA: Diagnosis not present

## 2018-04-10 DIAGNOSIS — I471 Supraventricular tachycardia: Secondary | ICD-10-CM | POA: Diagnosis not present

## 2018-04-10 DIAGNOSIS — M810 Age-related osteoporosis without current pathological fracture: Secondary | ICD-10-CM | POA: Diagnosis not present

## 2018-04-10 DIAGNOSIS — G4733 Obstructive sleep apnea (adult) (pediatric): Secondary | ICD-10-CM | POA: Diagnosis not present

## 2018-04-10 DIAGNOSIS — Z23 Encounter for immunization: Secondary | ICD-10-CM | POA: Diagnosis not present

## 2018-04-13 ENCOUNTER — Encounter: Payer: Self-pay | Admitting: Podiatry

## 2018-04-13 ENCOUNTER — Ambulatory Visit (INDEPENDENT_AMBULATORY_CARE_PROVIDER_SITE_OTHER): Payer: Medicare Other

## 2018-04-13 ENCOUNTER — Ambulatory Visit (INDEPENDENT_AMBULATORY_CARE_PROVIDER_SITE_OTHER): Payer: Medicare Other | Admitting: Podiatry

## 2018-04-13 DIAGNOSIS — S92354D Nondisplaced fracture of fifth metatarsal bone, right foot, subsequent encounter for fracture with routine healing: Secondary | ICD-10-CM

## 2018-04-13 NOTE — Progress Notes (Signed)
She presents today for follow-up of her fifth metatarsal fracture of her right foot she states that is feeling okay.  She presents in her Cam walker today.  Objective: Vital signs are stable she alert oriented x3 no ecchymosis edema has diminished to the lateral aspect fifth met right.  Radiographs taken today demonstrate still nonhealed fifth metatarsal but it appears to be's progressing ever so slightly.  Assessment: Jones fracture fifth right very slow to progress.  Plan: Recommended that she continue the use of the cam walker and I will follow-up with her in a few weeks.

## 2018-05-09 ENCOUNTER — Ambulatory Visit (INDEPENDENT_AMBULATORY_CARE_PROVIDER_SITE_OTHER): Payer: Medicare Other | Admitting: Podiatry

## 2018-05-09 ENCOUNTER — Ambulatory Visit (INDEPENDENT_AMBULATORY_CARE_PROVIDER_SITE_OTHER): Payer: Medicare Other

## 2018-05-09 ENCOUNTER — Encounter: Payer: Self-pay | Admitting: Podiatry

## 2018-05-09 DIAGNOSIS — S92354D Nondisplaced fracture of fifth metatarsal bone, right foot, subsequent encounter for fracture with routine healing: Secondary | ICD-10-CM

## 2018-05-09 NOTE — Progress Notes (Signed)
She presents today for follow-up of Jones fracture fifth metatarsal of the right foot.  States that it feels fine continues to wear her Darco shoe.  Objective: Vital signs are stable she alert and x3 no longer has any erythema or edema overlying the fifth metatarsal base though it is somewhat tender on palpation.  Review of radiographs taken today demonstrate approximately 65% consolidation from superior to inferior.  Assessment: Well-healing surgical foot.  Plan: Encouraged her to wear her Darco shoe and any stiff soled shoe that she wanted to at this point I will follow-up with her in about 6 weeks for another set of x-rays.  Hopefully that will be our final set at that time.

## 2018-05-10 ENCOUNTER — Telehealth: Payer: Self-pay | Admitting: Podiatry

## 2018-05-10 NOTE — Telephone Encounter (Signed)
Patient called requesting an updated form for a handicap sticker. Please call patient when completed and she will come by to pick it up.

## 2018-05-26 DIAGNOSIS — H04123 Dry eye syndrome of bilateral lacrimal glands: Secondary | ICD-10-CM | POA: Diagnosis not present

## 2018-05-26 DIAGNOSIS — H52203 Unspecified astigmatism, bilateral: Secondary | ICD-10-CM | POA: Diagnosis not present

## 2018-05-26 DIAGNOSIS — H25813 Combined forms of age-related cataract, bilateral: Secondary | ICD-10-CM | POA: Diagnosis not present

## 2018-05-26 DIAGNOSIS — H0100A Unspecified blepharitis right eye, upper and lower eyelids: Secondary | ICD-10-CM | POA: Diagnosis not present

## 2018-06-12 ENCOUNTER — Telehealth: Payer: Self-pay | Admitting: *Deleted

## 2018-06-12 NOTE — Telephone Encounter (Signed)
Pt states she is to see Dr. Milinda Pointer tomorrow to evaluate the healing of a fracture but due to the current COVID, she would like to know of this was a must or could she wait until June. I asked Dr. Stephenie Acres assistant - A. Prevatte, CMA, she stated if pt wanted to wait until June she should remain in the boot.

## 2018-06-12 NOTE — Telephone Encounter (Signed)
I informed pt of A. Prevatte's statement and pt states she would prefer to come in than wear the boot until June.

## 2018-06-13 ENCOUNTER — Ambulatory Visit (INDEPENDENT_AMBULATORY_CARE_PROVIDER_SITE_OTHER): Payer: Medicare Other | Admitting: Podiatry

## 2018-06-13 ENCOUNTER — Ambulatory Visit (INDEPENDENT_AMBULATORY_CARE_PROVIDER_SITE_OTHER): Payer: Medicare Other

## 2018-06-13 ENCOUNTER — Other Ambulatory Visit: Payer: Self-pay

## 2018-06-13 ENCOUNTER — Encounter: Payer: Self-pay | Admitting: Podiatry

## 2018-06-13 VITALS — Temp 97.3°F

## 2018-06-13 DIAGNOSIS — S92354D Nondisplaced fracture of fifth metatarsal bone, right foot, subsequent encounter for fracture with routine healing: Secondary | ICD-10-CM

## 2018-06-13 NOTE — Progress Notes (Signed)
She presents today for follow-up of Jones fracture fifth metatarsal base of the right foot.  She denies fever chills nausea vomiting muscle aches pain states she continues to wear her Darco shoe on a regular basis states that it is really does not hurt any longer.  Objective: Vital signs are stable she is alert and oriented x3.  There is no erythema edema cellulitis drainage or odor radiographs taken today demonstrate near consolidation of the fifth metatarsal.  Assessment: Well-healing Jones fracture fifth met base right x4 months.  Plan: Follow-up with her in 6 weeks for final set of x-rays.  I will allow her to get back to some light activity primarily walking. 

## 2018-07-25 ENCOUNTER — Ambulatory Visit: Payer: Medicare Other | Admitting: Podiatry

## 2018-07-26 DIAGNOSIS — Z1159 Encounter for screening for other viral diseases: Secondary | ICD-10-CM | POA: Diagnosis not present

## 2018-07-26 DIAGNOSIS — G4730 Sleep apnea, unspecified: Secondary | ICD-10-CM | POA: Diagnosis not present

## 2018-07-26 DIAGNOSIS — J300 Vasomotor rhinitis: Secondary | ICD-10-CM | POA: Diagnosis not present

## 2018-07-26 DIAGNOSIS — E559 Vitamin D deficiency, unspecified: Secondary | ICD-10-CM | POA: Diagnosis not present

## 2018-07-26 DIAGNOSIS — R5382 Chronic fatigue, unspecified: Secondary | ICD-10-CM | POA: Diagnosis not present

## 2018-07-26 DIAGNOSIS — G4700 Insomnia, unspecified: Secondary | ICD-10-CM | POA: Diagnosis not present

## 2018-07-26 DIAGNOSIS — E663 Overweight: Secondary | ICD-10-CM | POA: Diagnosis not present

## 2018-07-27 ENCOUNTER — Ambulatory Visit (INDEPENDENT_AMBULATORY_CARE_PROVIDER_SITE_OTHER): Payer: Medicare Other | Admitting: Podiatry

## 2018-07-27 ENCOUNTER — Ambulatory Visit (INDEPENDENT_AMBULATORY_CARE_PROVIDER_SITE_OTHER): Payer: Medicare Other

## 2018-07-27 ENCOUNTER — Encounter: Payer: Self-pay | Admitting: Podiatry

## 2018-07-27 ENCOUNTER — Other Ambulatory Visit: Payer: Self-pay

## 2018-07-27 VITALS — Temp 97.2°F

## 2018-07-27 DIAGNOSIS — S92354D Nondisplaced fracture of fifth metatarsal bone, right foot, subsequent encounter for fracture with routine healing: Secondary | ICD-10-CM | POA: Diagnosis not present

## 2018-07-27 NOTE — Progress Notes (Signed)
She presents today for follow-up of Jones fracture fifth metatarsal of the right foot.  She states that it feels so much better other than occasionally when I bend in the bed or touch something wrong and reminds me that it was there.  She presents with her tennis shoes.  Objective: Vital signs are stable she is alert and oriented x3 there is no erythema edema cellulitis drainage or odor no tenderness on palpation of the fifth metatarsal base.  Radiographs taken today demonstrate a 98% healed fracture with the plantar aspect 2 mm or so are not consolidated but other than that it is completely consolidated even on the oblique view.  Assessment: Well-healing Jones fracture fifth right.  Plan: Follow-up with her as needed.

## 2018-12-05 ENCOUNTER — Other Ambulatory Visit: Payer: Self-pay

## 2018-12-05 DIAGNOSIS — Z20822 Contact with and (suspected) exposure to covid-19: Secondary | ICD-10-CM

## 2018-12-06 LAB — NOVEL CORONAVIRUS, NAA: SARS-CoV-2, NAA: NOT DETECTED

## 2018-12-07 DIAGNOSIS — J300 Vasomotor rhinitis: Secondary | ICD-10-CM | POA: Diagnosis not present

## 2018-12-07 DIAGNOSIS — R05 Cough: Secondary | ICD-10-CM | POA: Diagnosis not present

## 2018-12-07 DIAGNOSIS — J069 Acute upper respiratory infection, unspecified: Secondary | ICD-10-CM | POA: Diagnosis not present

## 2018-12-07 DIAGNOSIS — J3089 Other allergic rhinitis: Secondary | ICD-10-CM | POA: Diagnosis not present

## 2018-12-12 DIAGNOSIS — M25511 Pain in right shoulder: Secondary | ICD-10-CM | POA: Diagnosis not present

## 2018-12-12 DIAGNOSIS — M7541 Impingement syndrome of right shoulder: Secondary | ICD-10-CM | POA: Diagnosis not present

## 2018-12-15 DIAGNOSIS — G4700 Insomnia, unspecified: Secondary | ICD-10-CM | POA: Diagnosis not present

## 2018-12-15 DIAGNOSIS — R5382 Chronic fatigue, unspecified: Secondary | ICD-10-CM | POA: Diagnosis not present

## 2018-12-15 DIAGNOSIS — E559 Vitamin D deficiency, unspecified: Secondary | ICD-10-CM | POA: Diagnosis not present

## 2018-12-18 DIAGNOSIS — Z23 Encounter for immunization: Secondary | ICD-10-CM | POA: Diagnosis not present

## 2019-01-04 DIAGNOSIS — C44622 Squamous cell carcinoma of skin of right upper limb, including shoulder: Secondary | ICD-10-CM | POA: Diagnosis not present

## 2019-01-04 DIAGNOSIS — Z85828 Personal history of other malignant neoplasm of skin: Secondary | ICD-10-CM | POA: Diagnosis not present

## 2019-01-04 DIAGNOSIS — D485 Neoplasm of uncertain behavior of skin: Secondary | ICD-10-CM | POA: Diagnosis not present

## 2019-01-10 DIAGNOSIS — M7541 Impingement syndrome of right shoulder: Secondary | ICD-10-CM | POA: Diagnosis not present

## 2019-01-11 DIAGNOSIS — L570 Actinic keratosis: Secondary | ICD-10-CM | POA: Diagnosis not present

## 2019-01-11 DIAGNOSIS — L218 Other seborrheic dermatitis: Secondary | ICD-10-CM | POA: Diagnosis not present

## 2019-01-11 DIAGNOSIS — D2221 Melanocytic nevi of right ear and external auricular canal: Secondary | ICD-10-CM | POA: Diagnosis not present

## 2019-01-11 DIAGNOSIS — D1801 Hemangioma of skin and subcutaneous tissue: Secondary | ICD-10-CM | POA: Diagnosis not present

## 2019-01-11 DIAGNOSIS — D225 Melanocytic nevi of trunk: Secondary | ICD-10-CM | POA: Diagnosis not present

## 2019-01-11 DIAGNOSIS — D2371 Other benign neoplasm of skin of right lower limb, including hip: Secondary | ICD-10-CM | POA: Diagnosis not present

## 2019-01-11 DIAGNOSIS — L821 Other seborrheic keratosis: Secondary | ICD-10-CM | POA: Diagnosis not present

## 2019-01-11 DIAGNOSIS — D224 Melanocytic nevi of scalp and neck: Secondary | ICD-10-CM | POA: Diagnosis not present

## 2019-01-11 DIAGNOSIS — Z85828 Personal history of other malignant neoplasm of skin: Secondary | ICD-10-CM | POA: Diagnosis not present

## 2019-01-11 DIAGNOSIS — L814 Other melanin hyperpigmentation: Secondary | ICD-10-CM | POA: Diagnosis not present

## 2019-01-11 DIAGNOSIS — D2239 Melanocytic nevi of other parts of face: Secondary | ICD-10-CM | POA: Diagnosis not present

## 2019-02-12 ENCOUNTER — Encounter: Payer: Self-pay | Admitting: Pulmonary Disease

## 2019-02-12 ENCOUNTER — Telehealth (INDEPENDENT_AMBULATORY_CARE_PROVIDER_SITE_OTHER): Payer: Medicare Other | Admitting: Pulmonary Disease

## 2019-02-12 DIAGNOSIS — G4733 Obstructive sleep apnea (adult) (pediatric): Secondary | ICD-10-CM

## 2019-02-12 NOTE — Progress Notes (Signed)
   Subjective:    Patient ID: Veronica Snyder, female    DOB: Aug 03, 1945, 73 y.o.   MRN: [id number]  HPI    I connected with  Veronica Snyder on 02/12/19 by a video enabled telemedicine application and verified that I am speaking with the correct person using two identifiers.   I discussed the limitations of evaluation and management by telemedicine. The patient expressed understanding and agreed to proceed.   73 yo woman for follow-up of mild OSA.  She was started on the machine in 2019 and this is really helped.  Morning headaches have resolved she does have slightly more energy. She has settled down with nasal pillows but would like to try nasal mask. No problems with pressure or dryness. Download was reviewed which shows excellent control of events on 8 cm and great compliance close to 8 hours every night with minimal leak  Significant tests/ events reviewed  04/2017 NPSG >>During the baseline portion, total sleep time was 121 minutes with AHI of about 18/h with RDI of 32/h. This was corrected by CPAP of 7 cm with nasal pillows. Most of the time was spent in 6 cm, few central seem to emerge in this level.  Review of Systems neg for any significant sore throat, dysphagia, itching, sneezing, nasal congestion or excess/ purulent secretions, fever, chills, sweats, unintended wt loss, pleuritic or exertional cp, hempoptysis, orthopnea pnd or change in chronic leg swelling. Also denies presyncope, palpitations, heartburn, abdominal pain, nausea, vomiting, diarrhea or change in bowel or urinary habits, dysuria,hematuria, rash, arthralgias, visual complaints, headache, numbness weakness or ataxia.     Objective:   Physical Exam   Unable since video visit      Assessment & Plan:    Total encounter time x 21 m

## 2019-02-12 NOTE — Patient Instructions (Signed)
  CPAP is working well. CPAP supplies will be renewed to [employer] [facility]

## 2019-02-12 NOTE — Assessment & Plan Note (Signed)
CPAP is working well. CPAP supplies will be renewed to [facility]   compliance with goal of at least 4-6 hrs every night is the expectation -she is very compliant and CPAP is certainly helped improve her daytime somnolence fatigue and morning headaches Advised against medications with sedative side effects Cautioned against driving when sleepy - understanding that sleepiness will vary on a day to day basis

## 2019-03-01 ENCOUNTER — Ambulatory Visit
Admission: EM | Admit: 2019-03-01 | Discharge: 2019-03-01 | Disposition: A | Discharge status: Home / Self Care | Payer: Medicare Other | Attending: Emergency Medicine | Admitting: Emergency Medicine

## 2019-03-01 DIAGNOSIS — R3915 Urgency of urination: Secondary | ICD-10-CM | POA: Diagnosis not present

## 2019-03-01 DIAGNOSIS — R300 Dysuria: Secondary | ICD-10-CM | POA: Diagnosis not present

## 2019-03-01 LAB — POCT URINALYSIS DIP (MANUAL ENTRY)
Bilirubin, UA: NEGATIVE
Glucose, UA: NEGATIVE mg/dL
Ketones, POC UA: NEGATIVE mg/dL
Nitrite, UA: NEGATIVE
Protein Ur, POC: NEGATIVE mg/dL
Spec Grav, UA: 1.01 (ref 1.010–1.025)
Urobilinogen, UA: 0.2 E.U./dL
pH, UA: 7 (ref 5.0–8.0)

## 2019-03-01 MED ORDER — NITROFURANTOIN MONOHYD MACRO 100 MG PO CAPS: 100.0000 mg | ORAL_CAPSULE | Freq: Two times a day (BID) | ORAL | 0 refills | Status: DC

## 2019-03-01 MED ORDER — PHENAZOPYRIDINE HCL 100 MG PO TABS: 100.0000 mg | ORAL_TABLET | Freq: Three times a day (TID) | ORAL | 0 refills | Status: DC | PRN

## 2019-03-01 NOTE — Discharge Instructions (Addendum)
Urine culture sent.  We will call you with the results.   Push fluids and get plenty of rest.   Take antibiotic as directed and to completion Take pyridium as prescribed and as needed for symptomatic relief Follow up with PCP if symptoms persists Return here or go to ER if you have any new or worsening symptoms such as fever, worsening abdominal pain, nausea/vomiting, flank pain

## 2019-03-01 NOTE — ED Provider Notes (Signed)
[facility]    CSN: [id number] Arrival date & time: 03/01/19  1158      History   Chief Complaint Chief Complaint  Patient presents with  . Urinary Tract Infection    HPI Veronica Snyder is a 73 y.o. female.   Patient also complains of dysuria, urgency for the past 12 hours.  She denies a precipitating event or recent sexual encounter.  She hasnt tried  Any medication.  Her symptoms are made worse with urination.  She reports similar symptoms in the past that improved with  nitrofurantoin.  She complains of decreased amount, increased urgency.  She denies fever, chills, nausea, vomiting, abdominal pain, flank pain, hematuria, or incontinence  The history is provided by the patient. No language interpreter was used.  Urinary Tract Infection   Past Medical History:  Diagnosis Date  . Palpitations    Short runs of SVT by event recording associated with patient's symptoms 2012    Patient Active Problem List   Diagnosis Date Noted  . OSA (obstructive sleep apnea) 07/19/2017  . Special screening for malignant neoplasms, colon 05/21/2016  . Abnormal weight gain 07/28/2015  . 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 Pain in joint, shoulder region 05/03/2012  . Impingement syndrome of left shoulder 05/03/2012  . 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 PALPITATIONS 03/13/2010    Past Surgical History:  Procedure Laterality Date  . COLONOSCOPY  2006   Negative  . COLONOSCOPY N/A 03/15/2018   Procedure: COLONOSCOPY;  Surgeon: Rogene Houston, MD;  Location: [facility];  Service: Endoscopy;  Laterality: N/A;  830  . IRRIGATION AND DEBRIDEMENT SEBACEOUS CYST  2005  . ORIF DISTAL RADIUS FRACTURE  2006   Right  . WISDOM TOOTH EXTRACTION      OB History   No obstetric history on file.      Home Medications    Prior to Admission medications   Medication Sig Start Date End Date Taking? Authorizing Provider  alendronate (FOSAMAX) 70 MG tablet  04/19/18    [provider]  Black Elderberry (SAMBUCUS ELDERBERRY) 50 MG/5ML SYRP Take 10-20 mg by mouth daily.    [provider]  Calcium Carb-Cholecalciferol (CALCIUM 600 + D PO) Take 1 tablet by mouth daily.    [provider]  cholecalciferol (VITAMIN D3) 25 MCG (1000 UT) tablet Take 1,000 Units by mouth daily.    [provider]  fluticasone (FLONASE) 50 MCG/ACT nasal spray Place 1 spray into both nostrils daily as needed for allergies.     [provider]  ipratropium (ATROVENT) 0.03 % nasal spray Place 2 sprays into both nostrils daily as needed for rhinitis.    [provider]  levocetirizine (XYZAL) 5 MG tablet Take 5 mg by mouth every evening.    [provider]  nitrofurantoin, macrocrystal-monohydrate, (MACROBID) 100 MG capsule Take 1 capsule (100 mg total) by mouth 2 (two) times daily. 03/01/19   [name], Veronica Hillock, FNP  phenazopyridine (PYRIDIUM) 100 MG tablet Take 1 tablet (100 mg total) by mouth 3 (three) times daily as needed for pain. 03/01/19   [name], Veronica Hillock, FNP  predniSONE (DELTASONE) 50 MG tablet One daily with food 03/17/18   Veronica Haber, MD  pseudoephedrine (SUDAFED) 30 MG tablet Take 30 mg by mouth every 4 (four) hours as needed for congestion.    [provider]  risedronate (ACTONEL) 35 MG tablet Take 35 mg by mouth every 7 (seven) days. with water on empty stomach, nothing  by mouth or lie down for next 30 minutes.    [provider]  tobramycin (TOBREX) 0.3 % ophthalmic solution Place 1 drop into both eyes every 6 (six) hours. 03/17/18   Veronica Haber, MD    Family History Family History  Problem Relation Age of Onset  . Alzheimer's disease Father     Social History Social History   Tobacco Use  . Smoking status: Never Smoker  . Smokeless tobacco: Never Used  Substance Use Topics  . Alcohol use: No  . Drug use: Never     Allergies   Penicillins and  Sulfamethoxazole   Review of Systems Review of Systems  Constitutional: Negative.   HENT: Negative.   Respiratory: Negative.   Cardiovascular: Negative.   Gastrointestinal: Negative.   Genitourinary: Positive for urgency.  ROS: All other are negatives   Physical Exam Triage Vital Signs ED Triage Vitals [03/01/19 1207]  Enc Vitals Group     BP 129/63     Pulse Rate 82     Resp 15     Temp 98.3 F (36.8 C)     Temp Source Oral     SpO2 94 %     Weight      Height      Head Circumference      Peak Flow      Pain Score      Pain Loc      Pain Edu?      Excl. in [place]?    No data found.  Updated Vital Signs BP 129/63 (BP Location: Right Arm)   Pulse 82   Temp 98.3 F (36.8 C) (Oral)   Resp 15   SpO2 95% Comment: recheck  Visual Acuity Right Eye Distance:   Left Eye Distance:   Bilateral Distance:    Right Eye Near:   Left Eye Near:    Bilateral Near:     Physical Exam Constitutional:      General: She is not in acute distress.    Appearance: Normal appearance. She is normal weight. She is not ill-appearing, toxic-appearing or diaphoretic.  Cardiovascular:     Rate and Rhythm: Normal rate and regular rhythm.     Pulses: Normal pulses.     Heart sounds: Normal heart sounds.  Pulmonary:     Effort: Pulmonary effort is normal. No respiratory distress.     Breath sounds: Normal breath sounds.  Chest:     Chest wall: No tenderness.  Abdominal:     General: Bowel sounds are normal. There is no distension.     Palpations: There is no mass.     Tenderness: There is no abdominal tenderness. There is no right CVA tenderness, guarding or rebound.     Hernia: No hernia is present.  Neurological:     Mental Status: She is alert and oriented to person, place, and time.      UC Treatments / Results  Labs (all labs ordered are listed, but only abnormal results are displayed) Labs Reviewed  POCT URINALYSIS DIP (MANUAL ENTRY) - Abnormal; Notable for the following  components:      Result Value   Blood, UA moderate (*)    Leukocytes, UA Large (3+) (*)    All other components within normal limits  URINE CULTURE    EKG   Radiology No results found.  Procedures Procedures (including critical care time)  Medications Ordered in UC Medications - No data to display  Initial Impression / Assessment and Plan /  UC Course  I have reviewed the triage vital signs and the nursing notes.  Pertinent labs & imaging results that were available during my care of the patient were reviewed by me and considered in my medical decision making (see chart for details).    UA showed moderate white blood cell and large leukocyte.  Urine was sent for culture.  Patient was treated with nitrofurantoin.  Will call patient if culture is abnormal.  To return for worsening of symptoms.  Patient verbalized understanding of the plan of care.  Final Clinical Impressions(s) / UC Diagnoses   Final diagnoses:  Dysuria  Urinary urgency     Discharge Instructions     Urine culture sent.  We will call you with the results.   Push fluids and get plenty of rest.   Take antibiotic as directed and to completion Take pyridium as prescribed and as needed for symptomatic relief Follow up with PCP if symptoms persists Return here or go to ER if you have any new or worsening symptoms such as fever, worsening abdominal pain, nausea/vomiting, flank pain     ED Prescriptions    Medication Sig Dispense Auth. Provider   nitrofurantoin, macrocrystal-monohydrate, (MACROBID) 100 MG capsule Take 1 capsule (100 mg total) by mouth 2 (two) times daily. 10 capsule Laiah Pouncey, Veronica Hillock, FNP   phenazopyridine (PYRIDIUM) 100 MG tablet Take 1 tablet (100 mg total) by mouth 3 (three) times daily as needed for pain. 10 tablet Reece Mcbroom, Veronica Hillock, FNP     PDMP not reviewed this encounter.   Emerson Monte, [place] 03/01/19 1247

## 2019-03-01 NOTE — ED Triage Notes (Signed)
Pt presents with dysuria that began recently

## 2019-03-02 LAB — URINE CULTURE: Culture: NO GROWTH

## 2019-03-05 ENCOUNTER — Other Ambulatory Visit: Payer: Self-pay

## 2019-03-05 ENCOUNTER — Ambulatory Visit: Payer: Medicare Other | Attending: Internal Medicine

## 2019-03-05 DIAGNOSIS — Z20822 Contact with and (suspected) exposure to covid-19: Secondary | ICD-10-CM

## 2019-03-07 LAB — NOVEL CORONAVIRUS, NAA: SARS-CoV-2, NAA: NOT DETECTED

## 2019-03-27 DIAGNOSIS — M65272 Calcific tendinitis, left ankle and foot: Secondary | ICD-10-CM | POA: Diagnosis not present

## 2019-03-28 ENCOUNTER — Ambulatory Visit (INDEPENDENT_AMBULATORY_CARE_PROVIDER_SITE_OTHER): Payer: Medicare Other

## 2019-03-28 ENCOUNTER — Other Ambulatory Visit: Payer: Self-pay | Admitting: Podiatry

## 2019-03-28 ENCOUNTER — Other Ambulatory Visit: Payer: Self-pay

## 2019-03-28 ENCOUNTER — Ambulatory Visit (INDEPENDENT_AMBULATORY_CARE_PROVIDER_SITE_OTHER): Payer: Medicare Other | Admitting: Podiatry

## 2019-03-28 ENCOUNTER — Encounter: Payer: Self-pay | Admitting: Podiatry

## 2019-03-28 DIAGNOSIS — M778 Other enthesopathies, not elsewhere classified: Secondary | ICD-10-CM

## 2019-03-28 DIAGNOSIS — S92354D Nondisplaced fracture of fifth metatarsal bone, right foot, subsequent encounter for fracture with routine healing: Secondary | ICD-10-CM

## 2019-03-28 NOTE — Progress Notes (Signed)
She presents today for follow-up of pain to her anterior ankle left.  She has been aching for the past few months been doing a lot more walking and exercising I really do not have any injury that I remember.  Objective: Vital signs are stable she is alert and oriented x3.  Pulses are palpable.  She is mild swelling in bilateral ankles and legs left greater than right no swelling in the dorsum of the foot no replicable pain.  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  Assessment: Possible contusion of the anterior ankle secondary to excessive ambulation.  Plan: Follow-up with me on an as-needed basis.

## 2019-04-04 ENCOUNTER — Ambulatory Visit: Payer: Medicare Other

## 2019-04-05 DIAGNOSIS — Z79899 Other long term (current) drug therapy: Secondary | ICD-10-CM | POA: Diagnosis not present

## 2019-04-05 DIAGNOSIS — G4733 Obstructive sleep apnea (adult) (pediatric): Secondary | ICD-10-CM | POA: Diagnosis not present

## 2019-04-05 DIAGNOSIS — M810 Age-related osteoporosis without current pathological fracture: Secondary | ICD-10-CM | POA: Diagnosis not present

## 2019-04-05 DIAGNOSIS — I471 Supraventricular tachycardia: Secondary | ICD-10-CM | POA: Diagnosis not present

## 2019-04-13 ENCOUNTER — Ambulatory Visit: Payer: Medicare Other | Attending: Internal Medicine

## 2019-04-17 DIAGNOSIS — N289 Disorder of kidney and ureter, unspecified: Secondary | ICD-10-CM | POA: Diagnosis not present

## 2019-04-17 DIAGNOSIS — M810 Age-related osteoporosis without current pathological fracture: Secondary | ICD-10-CM | POA: Diagnosis not present

## 2019-04-17 DIAGNOSIS — G4733 Obstructive sleep apnea (adult) (pediatric): Secondary | ICD-10-CM | POA: Diagnosis not present

## 2019-04-25 ENCOUNTER — Ambulatory Visit: Payer: Medicare Other

## 2019-05-02 DIAGNOSIS — L718 Other rosacea: Secondary | ICD-10-CM | POA: Diagnosis not present

## 2019-05-02 DIAGNOSIS — Z85828 Personal history of other malignant neoplasm of skin: Secondary | ICD-10-CM | POA: Diagnosis not present

## 2019-05-15 DIAGNOSIS — M810 Age-related osteoporosis without current pathological fracture: Secondary | ICD-10-CM | POA: Diagnosis not present

## 2019-05-15 DIAGNOSIS — N181 Chronic kidney disease, stage 1: Secondary | ICD-10-CM | POA: Diagnosis not present

## 2019-05-15 DIAGNOSIS — Z79899 Other long term (current) drug therapy: Secondary | ICD-10-CM | POA: Diagnosis not present

## 2019-05-22 DIAGNOSIS — N141 Nephropathy induced by other drugs, medicaments and biological substances: Secondary | ICD-10-CM | POA: Diagnosis not present

## 2019-05-28 ENCOUNTER — Telehealth: Payer: Self-pay | Admitting: Pulmonary Disease

## 2019-05-28 DIAGNOSIS — H04123 Dry eye syndrome of bilateral lacrimal glands: Secondary | ICD-10-CM | POA: Diagnosis not present

## 2019-05-28 DIAGNOSIS — H25013 Cortical age-related cataract, bilateral: Secondary | ICD-10-CM | POA: Diagnosis not present

## 2019-05-28 DIAGNOSIS — G4733 Obstructive sleep apnea (adult) (pediatric): Secondary | ICD-10-CM

## 2019-05-28 DIAGNOSIS — H43813 Vitreous degeneration, bilateral: Secondary | ICD-10-CM | POA: Diagnosis not present

## 2019-05-28 DIAGNOSIS — H524 Presbyopia: Secondary | ICD-10-CM | POA: Diagnosis not present

## 2019-05-28 NOTE — Telephone Encounter (Signed)
atc patient unable to reach, patient was seen 02/2019 supplies to be ordered then,  Order placed , left message on patient's machine per DPR. Nothing further needed at this time.

## 2019-10-01 DIAGNOSIS — E559 Vitamin D deficiency, unspecified: Secondary | ICD-10-CM | POA: Diagnosis not present

## 2019-10-01 DIAGNOSIS — R5383 Other fatigue: Secondary | ICD-10-CM | POA: Diagnosis not present

## 2019-10-01 DIAGNOSIS — D513 Other dietary vitamin B12 deficiency anemia: Secondary | ICD-10-CM | POA: Diagnosis not present

## 2019-11-06 DIAGNOSIS — S0083XA Contusion of other part of head, initial encounter: Secondary | ICD-10-CM | POA: Diagnosis not present

## 2019-11-19 DIAGNOSIS — S0992XA Unspecified injury of nose, initial encounter: Secondary | ICD-10-CM | POA: Diagnosis not present

## 2019-11-26 DIAGNOSIS — Z20828 Contact with and (suspected) exposure to other viral communicable diseases: Secondary | ICD-10-CM | POA: Diagnosis not present

## 2019-12-17 DIAGNOSIS — Z23 Encounter for immunization: Secondary | ICD-10-CM | POA: Diagnosis not present

## 2019-12-27 DIAGNOSIS — J3089 Other allergic rhinitis: Secondary | ICD-10-CM | POA: Diagnosis not present

## 2019-12-27 DIAGNOSIS — R519 Headache, unspecified: Secondary | ICD-10-CM | POA: Diagnosis not present

## 2019-12-27 DIAGNOSIS — J300 Vasomotor rhinitis: Secondary | ICD-10-CM | POA: Diagnosis not present

## 2020-01-11 DIAGNOSIS — Z23 Encounter for immunization: Secondary | ICD-10-CM | POA: Diagnosis not present

## 2020-01-28 DIAGNOSIS — L821 Other seborrheic keratosis: Secondary | ICD-10-CM | POA: Diagnosis not present

## 2020-01-28 DIAGNOSIS — D2372 Other benign neoplasm of skin of left lower limb, including hip: Secondary | ICD-10-CM | POA: Diagnosis not present

## 2020-01-28 DIAGNOSIS — L814 Other melanin hyperpigmentation: Secondary | ICD-10-CM | POA: Diagnosis not present

## 2020-01-28 DIAGNOSIS — D2239 Melanocytic nevi of other parts of face: Secondary | ICD-10-CM | POA: Diagnosis not present

## 2020-01-28 DIAGNOSIS — D1801 Hemangioma of skin and subcutaneous tissue: Secondary | ICD-10-CM | POA: Diagnosis not present

## 2020-01-28 DIAGNOSIS — Z85828 Personal history of other malignant neoplasm of skin: Secondary | ICD-10-CM | POA: Diagnosis not present

## 2020-03-26 DIAGNOSIS — Z1231 Encounter for screening mammogram for malignant neoplasm of breast: Secondary | ICD-10-CM | POA: Diagnosis not present

## 2020-03-26 DIAGNOSIS — M8589 Other specified disorders of bone density and structure, multiple sites: Secondary | ICD-10-CM | POA: Diagnosis not present

## 2020-04-07 DIAGNOSIS — Z124 Encounter for screening for malignant neoplasm of cervix: Secondary | ICD-10-CM | POA: Diagnosis not present

## 2020-04-07 DIAGNOSIS — Z01411 Encounter for gynecological examination (general) (routine) with abnormal findings: Secondary | ICD-10-CM | POA: Diagnosis not present

## 2020-04-07 DIAGNOSIS — Z6824 Body mass index (BMI) 24.0-24.9, adult: Secondary | ICD-10-CM | POA: Diagnosis not present

## 2020-04-07 DIAGNOSIS — M8580 Other specified disorders of bone density and structure, unspecified site: Secondary | ICD-10-CM | POA: Diagnosis not present

## 2020-04-07 DIAGNOSIS — K649 Unspecified hemorrhoids: Secondary | ICD-10-CM | POA: Diagnosis not present

## 2020-04-07 DIAGNOSIS — Z01419 Encounter for gynecological examination (general) (routine) without abnormal findings: Secondary | ICD-10-CM | POA: Diagnosis not present

## 2020-04-15 DIAGNOSIS — M810 Age-related osteoporosis without current pathological fracture: Secondary | ICD-10-CM | POA: Diagnosis not present

## 2020-04-15 DIAGNOSIS — M1990 Unspecified osteoarthritis, unspecified site: Secondary | ICD-10-CM | POA: Diagnosis not present

## 2020-04-15 DIAGNOSIS — G4733 Obstructive sleep apnea (adult) (pediatric): Secondary | ICD-10-CM | POA: Diagnosis not present

## 2020-04-15 DIAGNOSIS — N181 Chronic kidney disease, stage 1: Secondary | ICD-10-CM | POA: Diagnosis not present

## 2020-04-15 DIAGNOSIS — Z79899 Other long term (current) drug therapy: Secondary | ICD-10-CM | POA: Diagnosis not present

## 2020-04-22 DIAGNOSIS — G4733 Obstructive sleep apnea (adult) (pediatric): Secondary | ICD-10-CM | POA: Diagnosis not present

## 2020-04-22 DIAGNOSIS — M810 Age-related osteoporosis without current pathological fracture: Secondary | ICD-10-CM | POA: Diagnosis not present

## 2020-04-22 DIAGNOSIS — M1990 Unspecified osteoarthritis, unspecified site: Secondary | ICD-10-CM | POA: Diagnosis not present

## 2020-04-24 ENCOUNTER — Other Ambulatory Visit: Payer: Medicare Other

## 2020-04-24 DIAGNOSIS — Z20822 Contact with and (suspected) exposure to covid-19: Secondary | ICD-10-CM | POA: Diagnosis not present

## 2020-04-25 LAB — NOVEL CORONAVIRUS, NAA: SARS-CoV-2, NAA: NOT DETECTED

## 2020-04-25 LAB — SARS-COV-2, NAA 2 DAY TAT

## 2020-05-28 DIAGNOSIS — H52203 Unspecified astigmatism, bilateral: Secondary | ICD-10-CM | POA: Diagnosis not present

## 2020-05-28 DIAGNOSIS — H2513 Age-related nuclear cataract, bilateral: Secondary | ICD-10-CM | POA: Diagnosis not present

## 2020-05-28 DIAGNOSIS — H04123 Dry eye syndrome of bilateral lacrimal glands: Secondary | ICD-10-CM | POA: Diagnosis not present

## 2020-05-28 DIAGNOSIS — H25013 Cortical age-related cataract, bilateral: Secondary | ICD-10-CM | POA: Diagnosis not present

## 2020-05-30 DIAGNOSIS — B0050 Herpesviral ocular disease, unspecified: Secondary | ICD-10-CM | POA: Diagnosis not present

## 2020-06-02 ENCOUNTER — Ambulatory Visit (INDEPENDENT_AMBULATORY_CARE_PROVIDER_SITE_OTHER): Payer: Medicare Other | Admitting: Pulmonary Disease

## 2020-06-02 ENCOUNTER — Encounter: Payer: Self-pay | Admitting: Pulmonary Disease

## 2020-06-02 ENCOUNTER — Other Ambulatory Visit: Payer: Self-pay

## 2020-06-02 DIAGNOSIS — J300 Vasomotor rhinitis: Secondary | ICD-10-CM | POA: Diagnosis not present

## 2020-06-02 DIAGNOSIS — G4733 Obstructive sleep apnea (adult) (pediatric): Secondary | ICD-10-CM | POA: Diagnosis not present

## 2020-06-02 NOTE — Assessment & Plan Note (Signed)
-   Continue Flonase  °

## 2020-06-02 NOTE — Progress Notes (Signed)
   Subjective:    Patient ID: Veronica Snyder, female    DOB: 04-Feb-1946, 75 y.o.   MRN: [id number]  HPI  75 yo woman for follow-up of moderate OSA.  She has been maintained on CPAP 8 cm for more than 2 years with good results, her headaches have disappeared.  She still thinks that this is a nuisance, she alternates nasal pillows with nasal mask.  Her facial skin is very sensitive She has seen ENT in the past. She wears retainers No problems with pressure, wakes up feeling rested and denies sleep pressure in the daytime   Significant tests/ events reviewed  04/2017 NPSG >>During the baseline portion, total sleep time was 121 minutes with AHI of about 18/h with RDI of 32/h. This was corrected by CPAP of 7 cm with nasal pillows. Most of the time was spent in 6 cm,few central seem to emerge in this level.  Review of Systems neg for any significant sore throat, dysphagia, itching, sneezing, nasal congestion or excess/ purulent secretions, fever, chills, sweats, unintended wt loss, pleuritic or exertional cp, hempoptysis, orthopnea pnd or change in chronic leg swelling. Also denies presyncope, palpitations, heartburn, abdominal pain, nausea, vomiting, diarrhea or change in bowel or urinary habits, dysuria,hematuria, rash, arthralgias, visual complaints, headache, numbness weakness or ataxia.     Objective:   Physical Exam  Gen. Pleasant, well-nourished, in no distress ENT - no thrush, no pallor/icterus,no post nasal drip, overbite + Neck: No JVD, no thyromegaly, no carotid bruits Lungs: no use of accessory muscles, no dullness to percussion, clear without rales or rhonchi  Cardiovascular: Rhythm regular, heart sounds  normal, no murmurs or gallops, no peripheral edema Musculoskeletal: No deformities, no cyanosis or clubbing        Assessment & Plan:

## 2020-06-02 NOTE — Assessment & Plan Note (Signed)
CPAP download was reviewed which shows good control of events on 8 cm with excellent compliance and minimal leak. We discussed alternatives to CPAP -she has had excellent results with CPAP but feels that the mask may be a nuisance.  We discussed better fitting mask and I showed her AirFit N30 nasal cradle mask.  We discussed oral appliance and referral to dentist to get this custom-made.  She is worried that she may not get as effective treatment of her headaches with oral appliance as she did with CPAP. We discussed inspire device and she may be a good candidate for this given her low BMI

## 2020-06-02 NOTE — Patient Instructions (Signed)
  Trial of airfit N30 nasal cradle mask Referral to dentist - Dr Melburn Popper or Dr Elta Guadeloupe Ron Parker We discussed INSPIRE device

## 2020-06-09 DIAGNOSIS — J3089 Other allergic rhinitis: Secondary | ICD-10-CM | POA: Diagnosis not present

## 2020-06-09 DIAGNOSIS — J069 Acute upper respiratory infection, unspecified: Secondary | ICD-10-CM | POA: Diagnosis not present

## 2020-06-09 DIAGNOSIS — J300 Vasomotor rhinitis: Secondary | ICD-10-CM | POA: Diagnosis not present

## 2020-06-17 DIAGNOSIS — Z20822 Contact with and (suspected) exposure to covid-19: Secondary | ICD-10-CM | POA: Diagnosis not present

## 2020-07-14 ENCOUNTER — Ambulatory Visit (HOSPITAL_COMMUNITY): Payer: Self-pay

## 2020-07-14 DIAGNOSIS — L821 Other seborrheic keratosis: Secondary | ICD-10-CM | POA: Diagnosis not present

## 2020-07-14 DIAGNOSIS — B029 Zoster without complications: Secondary | ICD-10-CM | POA: Diagnosis not present

## 2020-07-14 DIAGNOSIS — B009 Herpesviral infection, unspecified: Secondary | ICD-10-CM | POA: Diagnosis not present

## 2020-07-14 DIAGNOSIS — Z85828 Personal history of other malignant neoplasm of skin: Secondary | ICD-10-CM | POA: Diagnosis not present

## 2020-08-01 DIAGNOSIS — Z23 Encounter for immunization: Secondary | ICD-10-CM | POA: Diagnosis not present

## 2020-09-05 DIAGNOSIS — Z20822 Contact with and (suspected) exposure to covid-19: Secondary | ICD-10-CM | POA: Diagnosis not present

## 2020-10-08 DIAGNOSIS — Z20822 Contact with and (suspected) exposure to covid-19: Secondary | ICD-10-CM | POA: Diagnosis not present

## 2020-10-14 IMAGING — DX DG CHEST 2V
2 series · 2 of 2 positions shown · non-contrast
Comparison: CT scan of the chest dated 09/26/2015

CLINICAL DATA: Productive cough for 3 months.

EXAM:
CHEST - 2 VIEW

[chest lat]
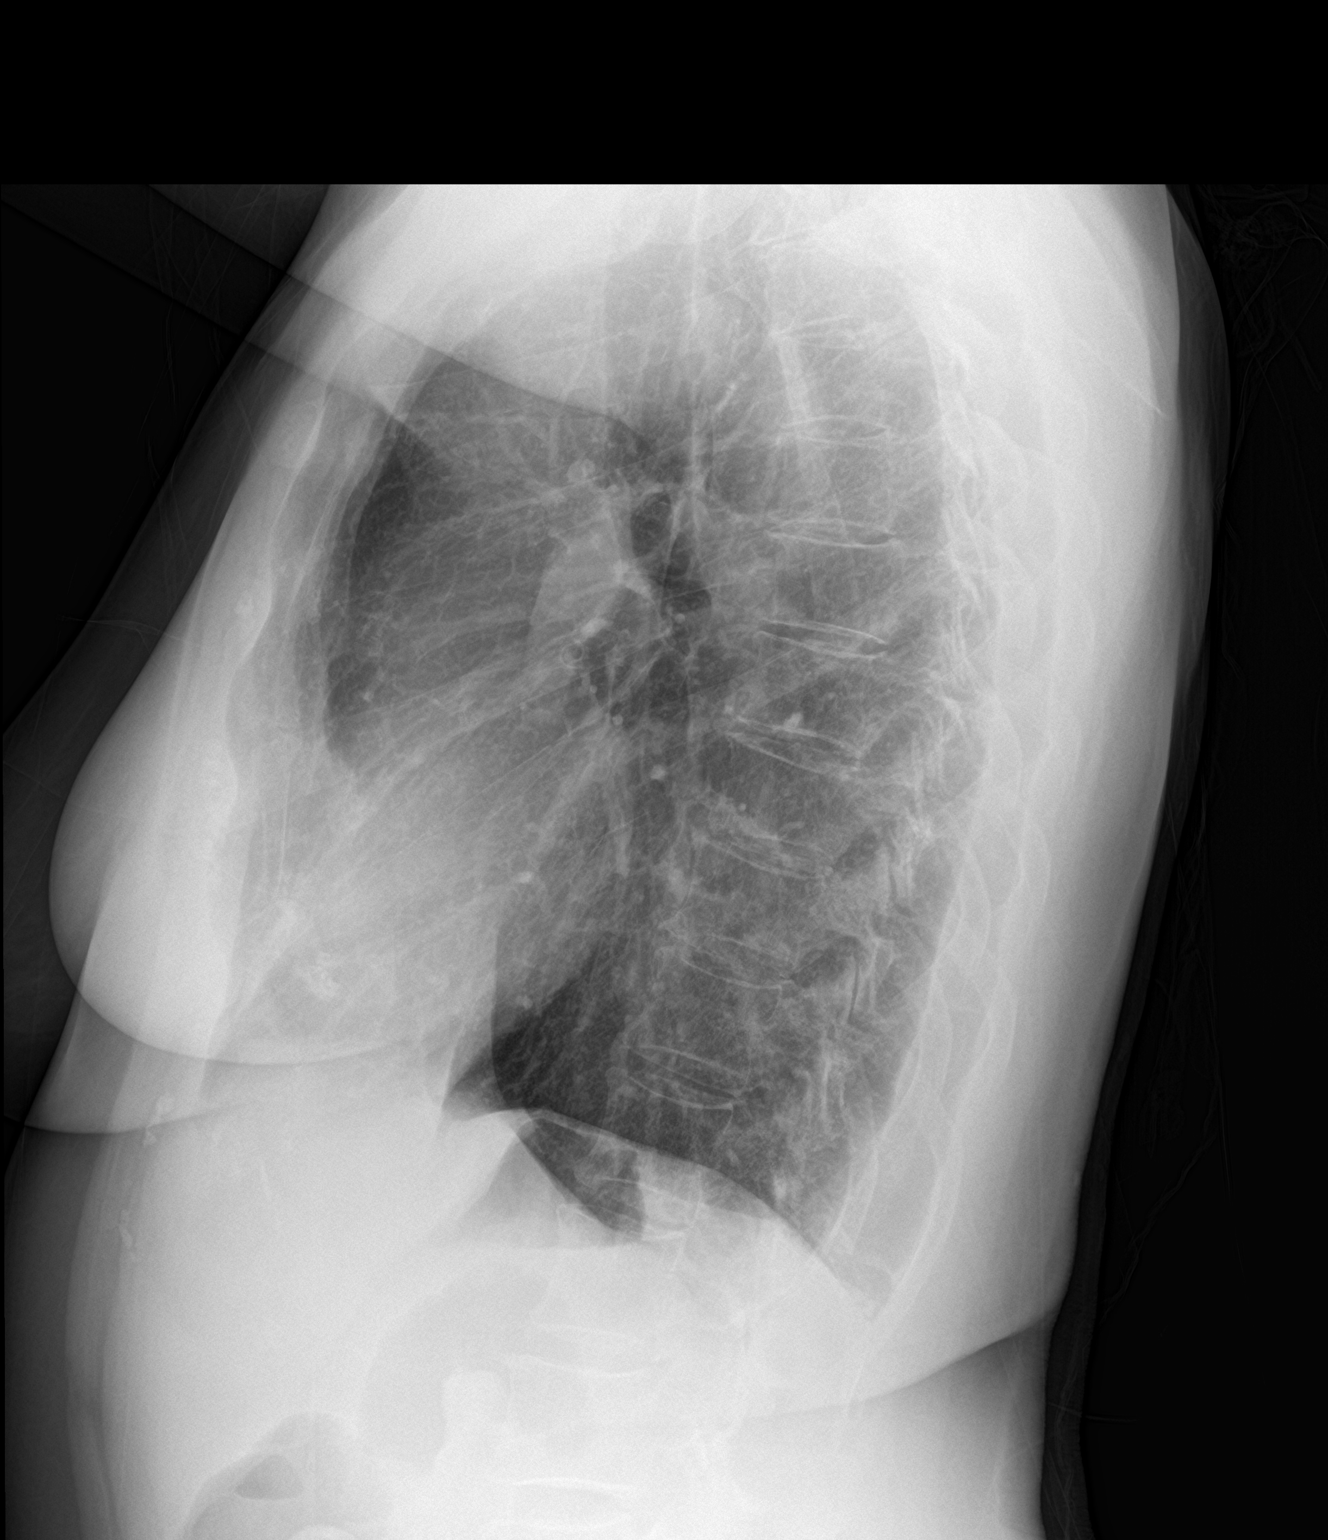

[chest pa]
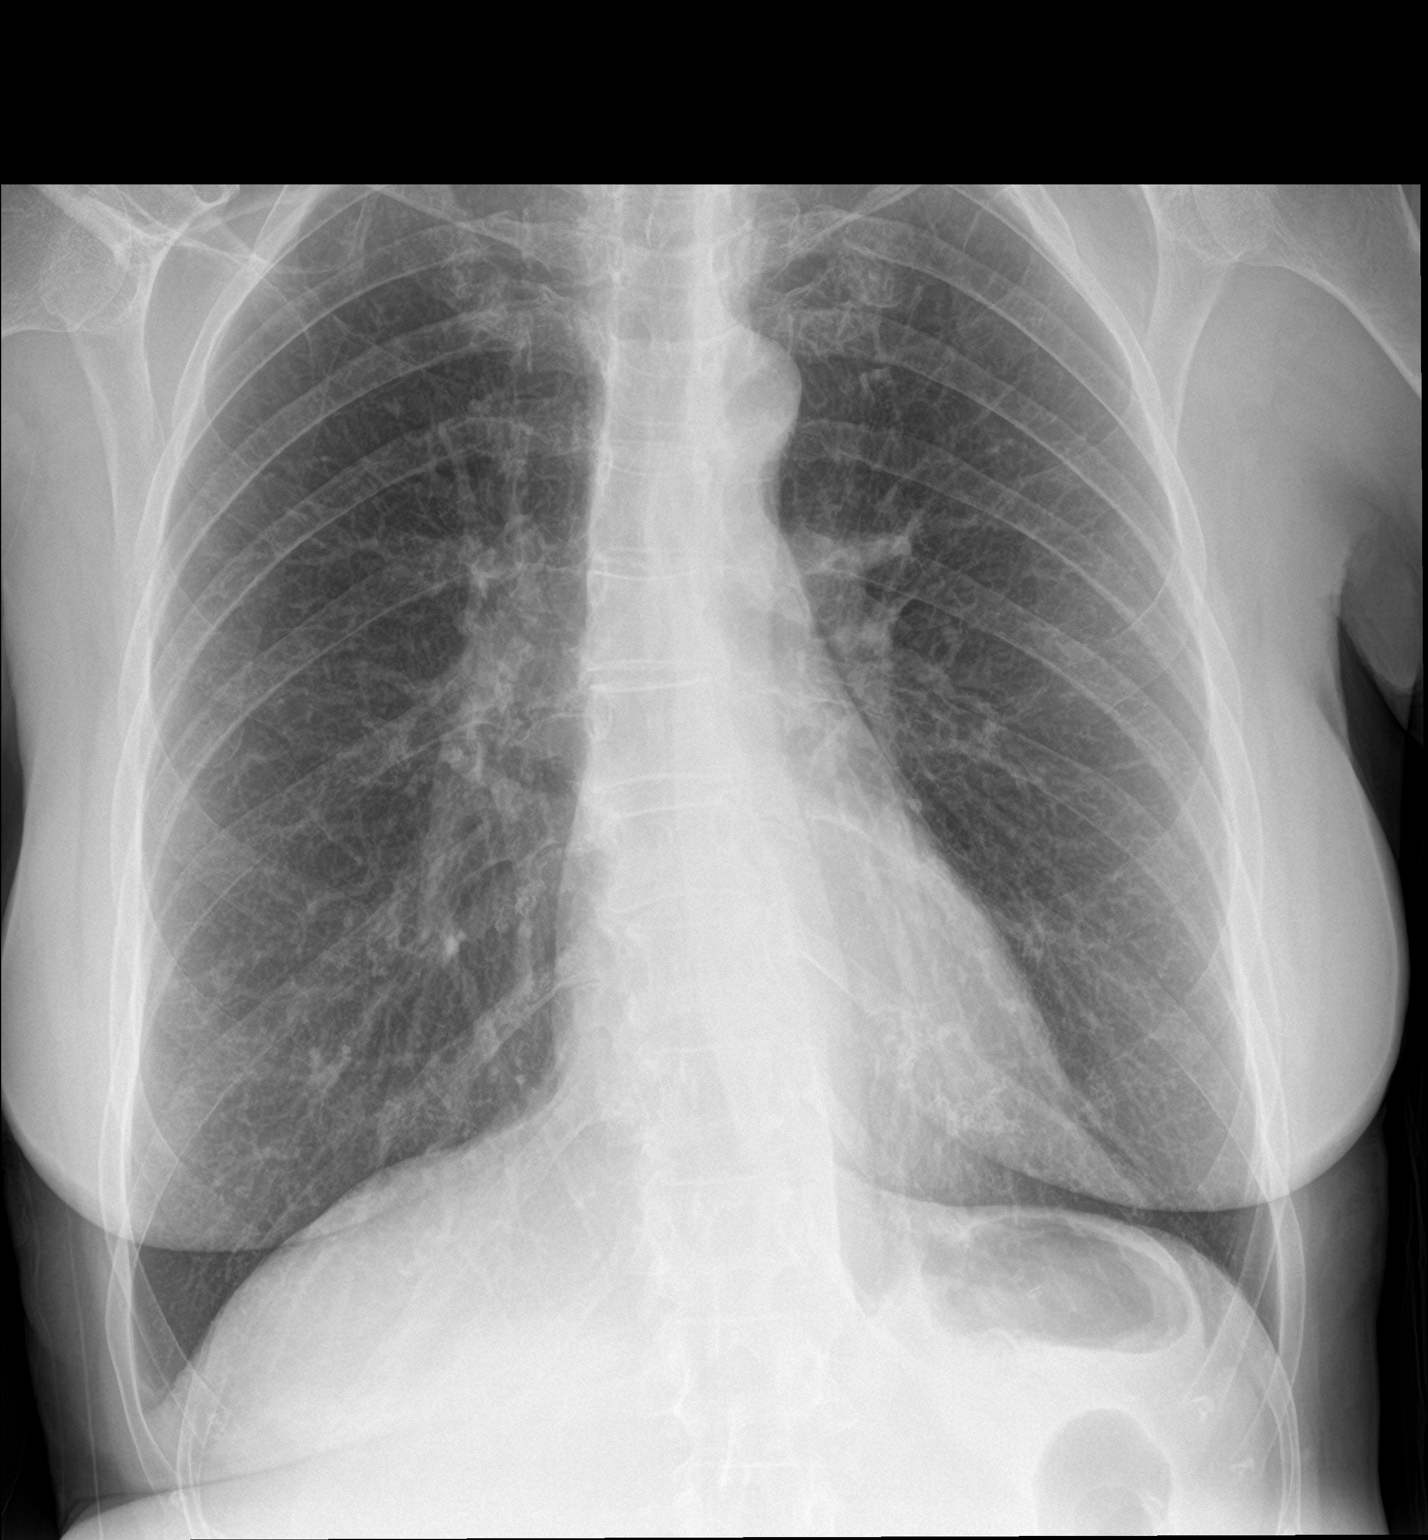

[2 of 2 positions shown; findings below may reference images not displayed]

FINDINGS: The heart size and pulmonary vascularity are normal. There are no
infiltrates or effusions. Tiny area of scarring laterally in the
right lower lobe as demonstrated on the prior CT scan. Bones appear
normal.
IMPRESSION: No active cardiopulmonary disease.

## 2020-12-05 DIAGNOSIS — Z23 Encounter for immunization: Secondary | ICD-10-CM | POA: Diagnosis not present

## 2020-12-25 DIAGNOSIS — J3089 Other allergic rhinitis: Secondary | ICD-10-CM | POA: Diagnosis not present

## 2020-12-25 DIAGNOSIS — J300 Vasomotor rhinitis: Secondary | ICD-10-CM | POA: Diagnosis not present

## 2020-12-29 DIAGNOSIS — Z23 Encounter for immunization: Secondary | ICD-10-CM | POA: Diagnosis not present

## 2021-02-03 DIAGNOSIS — E559 Vitamin D deficiency, unspecified: Secondary | ICD-10-CM | POA: Diagnosis not present

## 2021-02-03 DIAGNOSIS — R5382 Chronic fatigue, unspecified: Secondary | ICD-10-CM | POA: Diagnosis not present

## 2021-02-03 DIAGNOSIS — J300 Vasomotor rhinitis: Secondary | ICD-10-CM | POA: Diagnosis not present

## 2021-02-19 DIAGNOSIS — Z85828 Personal history of other malignant neoplasm of skin: Secondary | ICD-10-CM | POA: Diagnosis not present

## 2021-02-19 DIAGNOSIS — L570 Actinic keratosis: Secondary | ICD-10-CM | POA: Diagnosis not present

## 2021-02-19 DIAGNOSIS — L820 Inflamed seborrheic keratosis: Secondary | ICD-10-CM | POA: Diagnosis not present

## 2021-04-03 DIAGNOSIS — U099 Post covid-19 condition, unspecified: Secondary | ICD-10-CM | POA: Diagnosis not present

## 2021-04-13 DIAGNOSIS — Z20822 Contact with and (suspected) exposure to covid-19: Secondary | ICD-10-CM | POA: Diagnosis not present

## 2021-04-14 DIAGNOSIS — Z1231 Encounter for screening mammogram for malignant neoplasm of breast: Secondary | ICD-10-CM | POA: Diagnosis not present

## 2021-04-17 DIAGNOSIS — M810 Age-related osteoporosis without current pathological fracture: Secondary | ICD-10-CM | POA: Diagnosis not present

## 2021-04-17 DIAGNOSIS — G4733 Obstructive sleep apnea (adult) (pediatric): Secondary | ICD-10-CM | POA: Diagnosis not present

## 2021-04-17 DIAGNOSIS — M1990 Unspecified osteoarthritis, unspecified site: Secondary | ICD-10-CM | POA: Diagnosis not present

## 2021-04-17 DIAGNOSIS — Z79899 Other long term (current) drug therapy: Secondary | ICD-10-CM | POA: Diagnosis not present

## 2021-04-24 ENCOUNTER — Other Ambulatory Visit: Payer: Self-pay | Admitting: Internal Medicine

## 2021-04-24 DIAGNOSIS — J329 Chronic sinusitis, unspecified: Secondary | ICD-10-CM

## 2021-04-24 DIAGNOSIS — I471 Supraventricular tachycardia: Secondary | ICD-10-CM | POA: Diagnosis not present

## 2021-04-24 DIAGNOSIS — M8000XA Age-related osteoporosis with current pathological fracture, unspecified site, initial encounter for fracture: Secondary | ICD-10-CM | POA: Diagnosis not present

## 2021-04-24 DIAGNOSIS — Z6824 Body mass index (BMI) 24.0-24.9, adult: Secondary | ICD-10-CM | POA: Diagnosis not present

## 2021-04-24 DIAGNOSIS — G4739 Other sleep apnea: Secondary | ICD-10-CM | POA: Diagnosis not present

## 2021-04-29 DIAGNOSIS — Z124 Encounter for screening for malignant neoplasm of cervix: Secondary | ICD-10-CM | POA: Diagnosis not present

## 2021-04-29 DIAGNOSIS — Z01419 Encounter for gynecological examination (general) (routine) without abnormal findings: Secondary | ICD-10-CM | POA: Diagnosis not present

## 2021-04-29 DIAGNOSIS — M8580 Other specified disorders of bone density and structure, unspecified site: Secondary | ICD-10-CM | POA: Diagnosis not present

## 2021-04-29 DIAGNOSIS — Z6824 Body mass index (BMI) 24.0-24.9, adult: Secondary | ICD-10-CM | POA: Diagnosis not present

## 2021-04-29 DIAGNOSIS — Z01411 Encounter for gynecological examination (general) (routine) with abnormal findings: Secondary | ICD-10-CM | POA: Diagnosis not present

## 2021-05-06 ENCOUNTER — Other Ambulatory Visit: Payer: Self-pay

## 2021-05-06 ENCOUNTER — Ambulatory Visit (HOSPITAL_COMMUNITY)
Admission: RE | Admit: 2021-05-06 | Discharge: 2021-05-06 | Disposition: A | Discharge status: Home / Self Care | Payer: Medicare Other | Source: Ambulatory Visit | Attending: Internal Medicine | Admitting: Internal Medicine

## 2021-05-06 DIAGNOSIS — S0240DA Maxillary fracture, left side, initial encounter for closed fracture: Secondary | ICD-10-CM | POA: Diagnosis not present

## 2021-05-06 DIAGNOSIS — J329 Chronic sinusitis, unspecified: Secondary | ICD-10-CM | POA: Diagnosis not present

## 2021-05-06 DIAGNOSIS — G8929 Other chronic pain: Secondary | ICD-10-CM | POA: Diagnosis not present

## 2021-05-06 DIAGNOSIS — J342 Deviated nasal septum: Secondary | ICD-10-CM | POA: Diagnosis not present

## 2021-05-20 DIAGNOSIS — L821 Other seborrheic keratosis: Secondary | ICD-10-CM | POA: Diagnosis not present

## 2021-05-20 DIAGNOSIS — L438 Other lichen planus: Secondary | ICD-10-CM | POA: Diagnosis not present

## 2021-05-20 DIAGNOSIS — D2239 Melanocytic nevi of other parts of face: Secondary | ICD-10-CM | POA: Diagnosis not present

## 2021-05-20 DIAGNOSIS — Z85828 Personal history of other malignant neoplasm of skin: Secondary | ICD-10-CM | POA: Diagnosis not present

## 2021-05-20 DIAGNOSIS — B078 Other viral warts: Secondary | ICD-10-CM | POA: Diagnosis not present

## 2021-05-20 DIAGNOSIS — D225 Melanocytic nevi of trunk: Secondary | ICD-10-CM | POA: Diagnosis not present

## 2021-05-20 DIAGNOSIS — L814 Other melanin hyperpigmentation: Secondary | ICD-10-CM | POA: Diagnosis not present

## 2021-05-20 DIAGNOSIS — D2261 Melanocytic nevi of right upper limb, including shoulder: Secondary | ICD-10-CM | POA: Diagnosis not present

## 2021-05-25 DIAGNOSIS — Z20822 Contact with and (suspected) exposure to covid-19: Secondary | ICD-10-CM | POA: Diagnosis not present

## 2021-06-02 DIAGNOSIS — H43813 Vitreous degeneration, bilateral: Secondary | ICD-10-CM | POA: Diagnosis not present

## 2021-06-02 DIAGNOSIS — H25013 Cortical age-related cataract, bilateral: Secondary | ICD-10-CM | POA: Diagnosis not present

## 2021-06-02 DIAGNOSIS — H04123 Dry eye syndrome of bilateral lacrimal glands: Secondary | ICD-10-CM | POA: Diagnosis not present

## 2021-06-02 DIAGNOSIS — H5213 Myopia, bilateral: Secondary | ICD-10-CM | POA: Diagnosis not present

## 2021-06-11 DIAGNOSIS — Z20828 Contact with and (suspected) exposure to other viral communicable diseases: Secondary | ICD-10-CM | POA: Diagnosis not present

## 2021-06-13 DIAGNOSIS — Z20822 Contact with and (suspected) exposure to covid-19: Secondary | ICD-10-CM | POA: Diagnosis not present

## 2021-06-16 DIAGNOSIS — Z20822 Contact with and (suspected) exposure to covid-19: Secondary | ICD-10-CM | POA: Diagnosis not present

## 2021-06-19 DIAGNOSIS — J343 Hypertrophy of nasal turbinates: Secondary | ICD-10-CM | POA: Diagnosis not present

## 2021-06-19 DIAGNOSIS — J310 Chronic rhinitis: Secondary | ICD-10-CM | POA: Diagnosis not present

## 2021-06-19 DIAGNOSIS — J342 Deviated nasal septum: Secondary | ICD-10-CM | POA: Diagnosis not present

## 2021-07-03 DIAGNOSIS — Z20822 Contact with and (suspected) exposure to covid-19: Secondary | ICD-10-CM | POA: Diagnosis not present

## 2021-07-08 DIAGNOSIS — Z20822 Contact with and (suspected) exposure to covid-19: Secondary | ICD-10-CM | POA: Diagnosis not present

## 2021-07-13 DIAGNOSIS — Z20822 Contact with and (suspected) exposure to covid-19: Secondary | ICD-10-CM | POA: Diagnosis not present

## 2021-07-15 DIAGNOSIS — F4322 Adjustment disorder with anxiety: Secondary | ICD-10-CM | POA: Diagnosis not present

## 2021-07-16 ENCOUNTER — Ambulatory Visit (INDEPENDENT_AMBULATORY_CARE_PROVIDER_SITE_OTHER): Payer: Medicare Other | Admitting: Pulmonary Disease

## 2021-07-16 ENCOUNTER — Encounter: Payer: Self-pay | Admitting: Pulmonary Disease

## 2021-07-16 DIAGNOSIS — G4733 Obstructive sleep apnea (adult) (pediatric): Secondary | ICD-10-CM

## 2021-07-16 NOTE — Assessment & Plan Note (Signed)
She has a 2 mm underbite so dental appliance could be a good option but she seems to have settled down with CPAP nasal mask.  CPAP download was reviewed which shows excellent control of events on 8 cm with great compliance and minimal leak.  CPAP is only helped improve her daytime somnolence and fatigue.  CPAP supplies will be renewed for a year ?All her questions were answered and newer types of masks were discussed ? ?Weight loss encouraged, compliance with goal of at least 4-6 hrs every night is the expectation. ?Advised against medications with sedative side effects ?Cautioned against driving when sleepy - understanding that sleepiness will vary on a day to day basis ? ?

## 2021-07-16 NOTE — Patient Instructions (Signed)
CPAP is working well 

## 2021-07-16 NOTE — Progress Notes (Signed)
? ?  Subjective:  ? ? Patient ID: Veronica Snyder, female    DOB: 1945-04-25, 76 y.o.   MRN: [id number] ? ?HPI ? ?76 yo woman for follow-up of moderate OSA. ? -on CPAP 8 cm  ? ?Annual follow-up visit. ?On last visit we had discussed dental appliance but seems like she is tolerating CPAP better. ?She changed the mask to a nasal mask and this seems to be working better.  Denies problems with pressure or dryness. ?She had several questions today including ?New machine ?Trial CPAP ?Humidity settings ?Newer types of mask ? ?Significant tests/ events reviewed ? ?04/2017 NPSG >>During the baseline portion, total sleep time was 121 minutes with AHI of about 18/h with RDI of 32/h.  This was corrected by CPAP of 7 cm with nasal pillows.  Most of the time was spent in 6 cm, few central seem to emerge in this level. ? ?Review of Systems ?neg for any significant sore throat, dysphagia, itching, sneezing, nasal congestion or excess/ purulent secretions, fever, chills, sweats, unintended wt loss, pleuritic or exertional cp, hempoptysis, orthopnea pnd or change in chronic leg swelling. Also denies presyncope, palpitations, heartburn, abdominal pain, nausea, vomiting, diarrhea or change in bowel or urinary habits, dysuria,hematuria, rash, arthralgias, visual complaints, headache, numbness weakness or ataxia. ? ?   ?Objective:  ? Physical Exam ? ?Gen. Pleasant, well-nourished, in no distress ?ENT - no thrush, no pallor/icterus,no post nasal drip, 75m underbite ?Neck: No JVD, no thyromegaly, no carotid bruits ?Lungs: no use of accessory muscles, no dullness to percussion, clear without rales or rhonchi  ?Cardiovascular: Rhythm regular, heart sounds  normal, no murmurs or gallops, no peripheral edema ?Musculoskeletal: No deformities, no cyanosis or clubbing  ? ? ? ?   ?Assessment & Plan:  ? ? ?

## 2021-07-30 DIAGNOSIS — F4322 Adjustment disorder with anxiety: Secondary | ICD-10-CM | POA: Diagnosis not present

## 2021-08-12 DIAGNOSIS — F4322 Adjustment disorder with anxiety: Secondary | ICD-10-CM | POA: Diagnosis not present

## 2021-08-25 DIAGNOSIS — F4322 Adjustment disorder with anxiety: Secondary | ICD-10-CM | POA: Diagnosis not present

## 2021-09-15 DIAGNOSIS — F4322 Adjustment disorder with anxiety: Secondary | ICD-10-CM | POA: Diagnosis not present

## 2021-10-06 DIAGNOSIS — F4322 Adjustment disorder with anxiety: Secondary | ICD-10-CM | POA: Diagnosis not present

## 2021-10-27 DIAGNOSIS — F4322 Adjustment disorder with anxiety: Secondary | ICD-10-CM | POA: Diagnosis not present

## 2021-11-27 DIAGNOSIS — Z23 Encounter for immunization: Secondary | ICD-10-CM | POA: Diagnosis not present

## 2021-12-24 DIAGNOSIS — Z23 Encounter for immunization: Secondary | ICD-10-CM | POA: Diagnosis not present

## 2021-12-24 DIAGNOSIS — J3089 Other allergic rhinitis: Secondary | ICD-10-CM | POA: Diagnosis not present

## 2021-12-24 DIAGNOSIS — J300 Vasomotor rhinitis: Secondary | ICD-10-CM | POA: Diagnosis not present

## 2022-01-21 ENCOUNTER — Ambulatory Visit (INDEPENDENT_AMBULATORY_CARE_PROVIDER_SITE_OTHER): Payer: Medicare Other | Admitting: Podiatry

## 2022-01-21 ENCOUNTER — Encounter: Payer: Self-pay | Admitting: Podiatry

## 2022-01-21 DIAGNOSIS — D2371 Other benign neoplasm of skin of right lower limb, including hip: Secondary | ICD-10-CM | POA: Diagnosis not present

## 2022-01-21 DIAGNOSIS — L84 Corns and callosities: Secondary | ICD-10-CM

## 2022-01-21 DIAGNOSIS — M2041 Other hammer toe(s) (acquired), right foot: Secondary | ICD-10-CM

## 2022-01-21 NOTE — Progress Notes (Signed)
She presents today for a chief complaint of a small callused area to the fourth toe of the right foot lateral aspect.  She has been present for about 2 months she has used salicylic acid on it to help get rid of the callus but noticed the skin started peeling and is turning red and tender.  Objective: Vital signs stable tellurian x3 pulses are strong and palpable.  Lateral aspect of the fourth toe right foot does demonstrate some superficial skin breakdown with some benign reactive hyperkeratosis.  There is a small area of porokeratosis present.  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  Plan: Debridement of the lesion today placed padding today instructed her to watch for any signs symptoms of infection should any occur she is notify us immediately.  Otherwise she will follow-up with [place] periodically for debridement.

## 2022-01-22 DIAGNOSIS — J3089 Other allergic rhinitis: Secondary | ICD-10-CM | POA: Diagnosis not present

## 2022-01-22 DIAGNOSIS — J300 Vasomotor rhinitis: Secondary | ICD-10-CM | POA: Diagnosis not present

## 2022-01-22 DIAGNOSIS — J069 Acute upper respiratory infection, unspecified: Secondary | ICD-10-CM | POA: Diagnosis not present

## 2022-03-20 DIAGNOSIS — R6883 Chills (without fever): Secondary | ICD-10-CM | POA: Diagnosis not present

## 2022-03-20 DIAGNOSIS — Z6825 Body mass index (BMI) 25.0-25.9, adult: Secondary | ICD-10-CM | POA: Diagnosis not present

## 2022-03-20 DIAGNOSIS — Z03818 Encounter for observation for suspected exposure to other biological agents ruled out: Secondary | ICD-10-CM | POA: Diagnosis not present

## 2022-03-20 DIAGNOSIS — J3489 Other specified disorders of nose and nasal sinuses: Secondary | ICD-10-CM | POA: Diagnosis not present

## 2022-03-29 DIAGNOSIS — M7541 Impingement syndrome of right shoulder: Secondary | ICD-10-CM | POA: Diagnosis not present

## 2022-03-29 DIAGNOSIS — M7542 Impingement syndrome of left shoulder: Secondary | ICD-10-CM | POA: Diagnosis not present

## 2022-03-31 DIAGNOSIS — M25512 Pain in left shoulder: Secondary | ICD-10-CM | POA: Diagnosis not present

## 2022-03-31 DIAGNOSIS — M25511 Pain in right shoulder: Secondary | ICD-10-CM | POA: Diagnosis not present

## 2022-03-31 DIAGNOSIS — M6281 Muscle weakness (generalized): Secondary | ICD-10-CM | POA: Diagnosis not present

## 2022-04-02 DIAGNOSIS — M25511 Pain in right shoulder: Secondary | ICD-10-CM | POA: Diagnosis not present

## 2022-04-02 DIAGNOSIS — M6281 Muscle weakness (generalized): Secondary | ICD-10-CM | POA: Diagnosis not present

## 2022-04-02 DIAGNOSIS — M25512 Pain in left shoulder: Secondary | ICD-10-CM | POA: Diagnosis not present

## 2022-04-13 DIAGNOSIS — M6281 Muscle weakness (generalized): Secondary | ICD-10-CM | POA: Diagnosis not present

## 2022-04-13 DIAGNOSIS — M25511 Pain in right shoulder: Secondary | ICD-10-CM | POA: Diagnosis not present

## 2022-04-13 DIAGNOSIS — M25512 Pain in left shoulder: Secondary | ICD-10-CM | POA: Diagnosis not present

## 2022-04-15 DIAGNOSIS — M25511 Pain in right shoulder: Secondary | ICD-10-CM | POA: Diagnosis not present

## 2022-04-15 DIAGNOSIS — M25512 Pain in left shoulder: Secondary | ICD-10-CM | POA: Diagnosis not present

## 2022-04-15 DIAGNOSIS — M6281 Muscle weakness (generalized): Secondary | ICD-10-CM | POA: Diagnosis not present

## 2022-04-20 DIAGNOSIS — M8589 Other specified disorders of bone density and structure, multiple sites: Secondary | ICD-10-CM | POA: Diagnosis not present

## 2022-04-20 DIAGNOSIS — Z1231 Encounter for screening mammogram for malignant neoplasm of breast: Secondary | ICD-10-CM | POA: Diagnosis not present

## 2022-04-21 DIAGNOSIS — M6281 Muscle weakness (generalized): Secondary | ICD-10-CM | POA: Diagnosis not present

## 2022-04-21 DIAGNOSIS — M25512 Pain in left shoulder: Secondary | ICD-10-CM | POA: Diagnosis not present

## 2022-04-21 DIAGNOSIS — M25511 Pain in right shoulder: Secondary | ICD-10-CM | POA: Diagnosis not present

## 2022-04-26 DIAGNOSIS — M810 Age-related osteoporosis without current pathological fracture: Secondary | ICD-10-CM | POA: Diagnosis not present

## 2022-04-26 DIAGNOSIS — Z79899 Other long term (current) drug therapy: Secondary | ICD-10-CM | POA: Diagnosis not present

## 2022-04-26 DIAGNOSIS — M1990 Unspecified osteoarthritis, unspecified site: Secondary | ICD-10-CM | POA: Diagnosis not present

## 2022-04-26 DIAGNOSIS — G4733 Obstructive sleep apnea (adult) (pediatric): Secondary | ICD-10-CM | POA: Diagnosis not present

## 2022-04-26 DIAGNOSIS — I471 Supraventricular tachycardia, unspecified: Secondary | ICD-10-CM | POA: Diagnosis not present

## 2022-04-27 DIAGNOSIS — M25512 Pain in left shoulder: Secondary | ICD-10-CM | POA: Diagnosis not present

## 2022-04-27 DIAGNOSIS — M25511 Pain in right shoulder: Secondary | ICD-10-CM | POA: Diagnosis not present

## 2022-04-27 DIAGNOSIS — M6281 Muscle weakness (generalized): Secondary | ICD-10-CM | POA: Diagnosis not present

## 2022-05-03 DIAGNOSIS — M6281 Muscle weakness (generalized): Secondary | ICD-10-CM | POA: Diagnosis not present

## 2022-05-03 DIAGNOSIS — M25512 Pain in left shoulder: Secondary | ICD-10-CM | POA: Diagnosis not present

## 2022-05-03 DIAGNOSIS — M810 Age-related osteoporosis without current pathological fracture: Secondary | ICD-10-CM | POA: Diagnosis not present

## 2022-05-03 DIAGNOSIS — I471 Supraventricular tachycardia, unspecified: Secondary | ICD-10-CM | POA: Diagnosis not present

## 2022-05-03 DIAGNOSIS — M25511 Pain in right shoulder: Secondary | ICD-10-CM | POA: Diagnosis not present

## 2022-05-03 DIAGNOSIS — G4733 Obstructive sleep apnea (adult) (pediatric): Secondary | ICD-10-CM | POA: Diagnosis not present

## 2022-05-03 DIAGNOSIS — M1990 Unspecified osteoarthritis, unspecified site: Secondary | ICD-10-CM | POA: Diagnosis not present

## 2022-05-05 DIAGNOSIS — M6281 Muscle weakness (generalized): Secondary | ICD-10-CM | POA: Diagnosis not present

## 2022-05-05 DIAGNOSIS — M25511 Pain in right shoulder: Secondary | ICD-10-CM | POA: Diagnosis not present

## 2022-05-05 DIAGNOSIS — M25512 Pain in left shoulder: Secondary | ICD-10-CM | POA: Diagnosis not present

## 2022-05-10 DIAGNOSIS — M25512 Pain in left shoulder: Secondary | ICD-10-CM | POA: Diagnosis not present

## 2022-05-10 DIAGNOSIS — M25511 Pain in right shoulder: Secondary | ICD-10-CM | POA: Diagnosis not present

## 2022-05-11 DIAGNOSIS — Z6825 Body mass index (BMI) 25.0-25.9, adult: Secondary | ICD-10-CM | POA: Diagnosis not present

## 2022-05-11 DIAGNOSIS — M8580 Other specified disorders of bone density and structure, unspecified site: Secondary | ICD-10-CM | POA: Diagnosis not present

## 2022-05-11 DIAGNOSIS — Z01411 Encounter for gynecological examination (general) (routine) with abnormal findings: Secondary | ICD-10-CM | POA: Diagnosis not present

## 2022-05-11 DIAGNOSIS — Z124 Encounter for screening for malignant neoplasm of cervix: Secondary | ICD-10-CM | POA: Diagnosis not present

## 2022-05-11 DIAGNOSIS — Z01419 Encounter for gynecological examination (general) (routine) without abnormal findings: Secondary | ICD-10-CM | POA: Diagnosis not present

## 2022-05-12 DIAGNOSIS — M6281 Muscle weakness (generalized): Secondary | ICD-10-CM | POA: Diagnosis not present

## 2022-05-12 DIAGNOSIS — M25512 Pain in left shoulder: Secondary | ICD-10-CM | POA: Diagnosis not present

## 2022-05-12 DIAGNOSIS — M25511 Pain in right shoulder: Secondary | ICD-10-CM | POA: Diagnosis not present

## 2022-05-20 DIAGNOSIS — M25511 Pain in right shoulder: Secondary | ICD-10-CM | POA: Diagnosis not present

## 2022-05-20 DIAGNOSIS — M25512 Pain in left shoulder: Secondary | ICD-10-CM | POA: Diagnosis not present

## 2022-05-20 DIAGNOSIS — M6281 Muscle weakness (generalized): Secondary | ICD-10-CM | POA: Diagnosis not present

## 2022-05-26 DIAGNOSIS — M25512 Pain in left shoulder: Secondary | ICD-10-CM | POA: Diagnosis not present

## 2022-05-26 DIAGNOSIS — M25511 Pain in right shoulder: Secondary | ICD-10-CM | POA: Diagnosis not present

## 2022-05-26 DIAGNOSIS — M6281 Muscle weakness (generalized): Secondary | ICD-10-CM | POA: Diagnosis not present

## 2022-06-03 DIAGNOSIS — M6281 Muscle weakness (generalized): Secondary | ICD-10-CM | POA: Diagnosis not present

## 2022-06-03 DIAGNOSIS — M25511 Pain in right shoulder: Secondary | ICD-10-CM | POA: Diagnosis not present

## 2022-06-03 DIAGNOSIS — M25512 Pain in left shoulder: Secondary | ICD-10-CM | POA: Diagnosis not present

## 2022-06-09 DIAGNOSIS — H524 Presbyopia: Secondary | ICD-10-CM | POA: Diagnosis not present

## 2022-06-09 DIAGNOSIS — H2513 Age-related nuclear cataract, bilateral: Secondary | ICD-10-CM | POA: Diagnosis not present

## 2022-06-09 DIAGNOSIS — H25013 Cortical age-related cataract, bilateral: Secondary | ICD-10-CM | POA: Diagnosis not present

## 2022-06-09 DIAGNOSIS — H43813 Vitreous degeneration, bilateral: Secondary | ICD-10-CM | POA: Diagnosis not present

## 2022-07-07 ENCOUNTER — Ambulatory Visit: Payer: Medicare Other | Admitting: Pulmonary Disease

## 2022-07-07 DIAGNOSIS — D2239 Melanocytic nevi of other parts of face: Secondary | ICD-10-CM | POA: Diagnosis not present

## 2022-07-07 DIAGNOSIS — D1801 Hemangioma of skin and subcutaneous tissue: Secondary | ICD-10-CM | POA: Diagnosis not present

## 2022-07-07 DIAGNOSIS — Z85828 Personal history of other malignant neoplasm of skin: Secondary | ICD-10-CM | POA: Diagnosis not present

## 2022-07-07 DIAGNOSIS — L570 Actinic keratosis: Secondary | ICD-10-CM | POA: Diagnosis not present

## 2022-07-07 DIAGNOSIS — L821 Other seborrheic keratosis: Secondary | ICD-10-CM | POA: Diagnosis not present

## 2022-07-07 DIAGNOSIS — L718 Other rosacea: Secondary | ICD-10-CM | POA: Diagnosis not present

## 2022-07-07 DIAGNOSIS — D2372 Other benign neoplasm of skin of left lower limb, including hip: Secondary | ICD-10-CM | POA: Diagnosis not present

## 2022-07-07 DIAGNOSIS — D225 Melanocytic nevi of trunk: Secondary | ICD-10-CM | POA: Diagnosis not present

## 2022-07-07 DIAGNOSIS — L814 Other melanin hyperpigmentation: Secondary | ICD-10-CM | POA: Diagnosis not present

## 2022-07-07 DIAGNOSIS — L820 Inflamed seborrheic keratosis: Secondary | ICD-10-CM | POA: Diagnosis not present

## 2022-07-09 DIAGNOSIS — H903 Sensorineural hearing loss, bilateral: Secondary | ICD-10-CM | POA: Diagnosis not present

## 2022-07-15 ENCOUNTER — Telehealth (HOSPITAL_BASED_OUTPATIENT_CLINIC_OR_DEPARTMENT_OTHER): Payer: Self-pay | Admitting: Pulmonary Disease

## 2022-07-15 ENCOUNTER — Ambulatory Visit (INDEPENDENT_AMBULATORY_CARE_PROVIDER_SITE_OTHER): Payer: Medicare Other | Admitting: Pulmonary Disease

## 2022-07-15 ENCOUNTER — Encounter (HOSPITAL_BASED_OUTPATIENT_CLINIC_OR_DEPARTMENT_OTHER): Payer: Self-pay | Admitting: Pulmonary Disease

## 2022-07-15 VITALS — BP 114/68 | HR 73 | Temp 98.3°F | Ht 68.0 in | Wt 168.4 lb

## 2022-07-15 DIAGNOSIS — G4733 Obstructive sleep apnea (adult) (pediatric): Secondary | ICD-10-CM

## 2022-07-15 NOTE — Patient Instructions (Signed)
X rx for replacement autoCPAP 7-10cm to new DME in [place]  Look up travel CPAP - resmed mini -best brand  Consult with sleep dentist - dr Althea Grimmer or Dr Emeline General for dental appliance

## 2022-07-15 NOTE — Telephone Encounter (Signed)
Patient states Dr. Althea Grimmer office stated that she would need a referral in order to be seen. Please advise. [facility] phone number [phone] or [employer] [phone]

## 2022-07-15 NOTE — Assessment & Plan Note (Addendum)
CPAP download shows few residual events AHI 6/hour but on certain nights as high as 10/hour on 8 cm.  She is very compliant more than 8 hours per night.  CPAP is only helped improve her daytime somnolence, fatigue and headaches  We will provide her with a replacement machine and set it to auto settings 7 to 10 cm.  On her titration study centrals emerged at high pressures We discussed travel CPAP and their limitations.  We discussed alternative of using dental appliance for travel.  We gave her referral to sleep dentist  compliance with goal of at least 4-6 hrs every night is the expectation. Advised against medications with sedative side effects Cautioned against driving when sleepy - understanding that sleepiness will vary on a day to day basis

## 2022-07-15 NOTE — Progress Notes (Signed)
   Subjective:    Patient ID: Veronica Snyder, female    DOB: [birth year], 77 y.o.   MRN: [id number]  HPI  77  yo woman for follow-up of moderate OSA.  -on CPAP 8 cm   Annual follow-up visit. She is compliant with her machine.  Her headaches have resolved.  She has settled down with a nasal mask Previously we recommended sleep dentist referral but she has not followed through.  She uses retainers and is comfortable using them.  She requests a replacement machine.  She also wants to discuss travel CPAP. She has sinus issues and has been recommended surgery  Significant tests/ events reviewed   04/2017 NPSG >> baseline -TST 121 minutes , AHI 18/h with RDI of 32/h>>corrected by CPAP of 7 cm with nasal pillows.  Most of the time was spent in 6 cm, few centrals   Review of Systems neg for any significant sore throat, dysphagia, itching, sneezing, nasal congestion or excess/ purulent secretions, fever, chills, sweats, unintended wt loss, pleuritic or exertional cp, hempoptysis, orthopnea pnd or change in chronic leg swelling. Also denies presyncope, palpitations, heartburn, abdominal pain, nausea, vomiting, diarrhea or change in bowel or urinary habits, dysuria,hematuria, rash, arthralgias, visual complaints, headache, numbness weakness or ataxia.     Objective:   Physical Exam  Gen. Pleasant, well-nourished, in no distress ENT - no thrush, no pallor/icterus,no post nasal drip, 1mm undererbite Neck: No JVD, no thyromegaly, no carotid bruits Lungs: no use of accessory muscles, no dullness to percussion, clear without rales or rhonchi  Cardiovascular: Rhythm regular, heart sounds  normal, no murmurs or gallops, no peripheral edema Musculoskeletal: No deformities, no cyanosis or clubbing        Assessment & Plan:

## 2022-07-16 NOTE — Telephone Encounter (Signed)
Referral to Dr. Althea Grimmer has been placed. Attempted to call pt but unable to reach. Left her a detailed message letting her know that the referral had been placed. Nothing further needed.

## 2022-10-08 ENCOUNTER — Telehealth: Payer: Self-pay | Admitting: Pulmonary Disease

## 2022-10-08 NOTE — Telephone Encounter (Signed)
LOV 07/15/22. [employer] is asking for RA to write her a "followup note" so that she can continue to order supplies though them. Patient also states she is starting a mouth appliance via Dr Myrtis Ser. Wants to know if shell need to see RA to f/u on that as well. Please advise.

## 2022-10-12 NOTE — Telephone Encounter (Signed)
Note sent to [employer].

## 2022-11-25 DIAGNOSIS — R635 Abnormal weight gain: Secondary | ICD-10-CM | POA: Diagnosis not present

## 2022-11-25 DIAGNOSIS — R799 Abnormal finding of blood chemistry, unspecified: Secondary | ICD-10-CM | POA: Diagnosis not present

## 2022-11-29 DIAGNOSIS — Z23 Encounter for immunization: Secondary | ICD-10-CM | POA: Diagnosis not present

## 2022-12-09 DIAGNOSIS — Z23 Encounter for immunization: Secondary | ICD-10-CM | POA: Diagnosis not present

## 2022-12-15 DIAGNOSIS — R300 Dysuria: Secondary | ICD-10-CM | POA: Diagnosis not present

## 2022-12-15 DIAGNOSIS — N3001 Acute cystitis with hematuria: Secondary | ICD-10-CM | POA: Diagnosis not present

## 2022-12-23 DIAGNOSIS — R7303 Prediabetes: Secondary | ICD-10-CM | POA: Diagnosis not present

## 2022-12-23 DIAGNOSIS — G4733 Obstructive sleep apnea (adult) (pediatric): Secondary | ICD-10-CM | POA: Diagnosis not present

## 2022-12-23 DIAGNOSIS — D751 Secondary polycythemia: Secondary | ICD-10-CM | POA: Diagnosis not present

## 2022-12-23 DIAGNOSIS — Z6826 Body mass index (BMI) 26.0-26.9, adult: Secondary | ICD-10-CM | POA: Diagnosis not present

## 2022-12-23 DIAGNOSIS — E663 Overweight: Secondary | ICD-10-CM | POA: Diagnosis not present

## 2022-12-27 DIAGNOSIS — J300 Vasomotor rhinitis: Secondary | ICD-10-CM | POA: Diagnosis not present

## 2022-12-27 DIAGNOSIS — J3089 Other allergic rhinitis: Secondary | ICD-10-CM | POA: Diagnosis not present

## 2023-01-03 ENCOUNTER — Telehealth: Payer: Self-pay | Admitting: Pulmonary Disease

## 2023-01-03 DIAGNOSIS — L570 Actinic keratosis: Secondary | ICD-10-CM | POA: Diagnosis not present

## 2023-01-03 DIAGNOSIS — Z85828 Personal history of other malignant neoplasm of skin: Secondary | ICD-10-CM | POA: Diagnosis not present

## 2023-01-03 DIAGNOSIS — B351 Tinea unguium: Secondary | ICD-10-CM | POA: Diagnosis not present

## 2023-01-03 NOTE — Telephone Encounter (Signed)
Patient would like order for Travel CPAP machine. Resmed [employer]. Needs pressure settings. Patient would like order to go to [facility]. Patient phone number is [phone].

## 2023-01-04 ENCOUNTER — Other Ambulatory Visit (HOSPITAL_BASED_OUTPATIENT_CLINIC_OR_DEPARTMENT_OTHER): Payer: Self-pay

## 2023-01-04 DIAGNOSIS — G4733 Obstructive sleep apnea (adult) (pediatric): Secondary | ICD-10-CM

## 2023-01-04 NOTE — Telephone Encounter (Signed)
Order was placed for mini cpap machine and sent to [facility], called and spoke with patient to informed pt that order was sent to [facility]

## 2023-01-04 NOTE — Telephone Encounter (Signed)
Is this okay ,please advise  ?

## 2023-01-31 DIAGNOSIS — Z713 Dietary counseling and surveillance: Secondary | ICD-10-CM | POA: Diagnosis not present

## 2023-01-31 DIAGNOSIS — R7303 Prediabetes: Secondary | ICD-10-CM | POA: Diagnosis not present

## 2023-01-31 DIAGNOSIS — Z6824 Body mass index (BMI) 24.0-24.9, adult: Secondary | ICD-10-CM | POA: Diagnosis not present

## 2023-03-08 DIAGNOSIS — R7303 Prediabetes: Secondary | ICD-10-CM | POA: Diagnosis not present

## 2023-03-08 DIAGNOSIS — E785 Hyperlipidemia, unspecified: Secondary | ICD-10-CM | POA: Diagnosis not present

## 2023-03-08 DIAGNOSIS — R635 Abnormal weight gain: Secondary | ICD-10-CM | POA: Diagnosis not present

## 2023-03-08 DIAGNOSIS — D751 Secondary polycythemia: Secondary | ICD-10-CM | POA: Diagnosis not present

## 2023-04-13 DIAGNOSIS — Z20822 Contact with and (suspected) exposure to covid-19: Secondary | ICD-10-CM | POA: Diagnosis not present

## 2023-04-13 DIAGNOSIS — J069 Acute upper respiratory infection, unspecified: Secondary | ICD-10-CM | POA: Diagnosis not present

## 2023-04-17 DIAGNOSIS — J209 Acute bronchitis, unspecified: Secondary | ICD-10-CM | POA: Diagnosis not present

## 2023-04-17 DIAGNOSIS — R051 Acute cough: Secondary | ICD-10-CM | POA: Diagnosis not present

## 2023-04-18 ENCOUNTER — Ambulatory Visit (HOSPITAL_COMMUNITY): Payer: Self-pay

## 2023-04-21 DIAGNOSIS — R051 Acute cough: Secondary | ICD-10-CM | POA: Diagnosis not present

## 2023-04-26 DIAGNOSIS — Z1231 Encounter for screening mammogram for malignant neoplasm of breast: Secondary | ICD-10-CM | POA: Diagnosis not present

## 2023-05-05 DIAGNOSIS — E785 Hyperlipidemia, unspecified: Secondary | ICD-10-CM | POA: Diagnosis not present

## 2023-05-05 DIAGNOSIS — D751 Secondary polycythemia: Secondary | ICD-10-CM | POA: Diagnosis not present

## 2023-05-05 DIAGNOSIS — R7303 Prediabetes: Secondary | ICD-10-CM | POA: Diagnosis not present

## 2023-05-19 DIAGNOSIS — Z0000 Encounter for general adult medical examination without abnormal findings: Secondary | ICD-10-CM | POA: Diagnosis not present

## 2023-05-19 DIAGNOSIS — R519 Headache, unspecified: Secondary | ICD-10-CM | POA: Diagnosis not present

## 2023-05-19 DIAGNOSIS — N393 Stress incontinence (female) (male): Secondary | ICD-10-CM | POA: Diagnosis not present

## 2023-05-19 DIAGNOSIS — G4733 Obstructive sleep apnea (adult) (pediatric): Secondary | ICD-10-CM | POA: Diagnosis not present

## 2023-05-19 DIAGNOSIS — Z1159 Encounter for screening for other viral diseases: Secondary | ICD-10-CM | POA: Diagnosis not present

## 2023-05-19 DIAGNOSIS — R7303 Prediabetes: Secondary | ICD-10-CM | POA: Diagnosis not present

## 2023-05-19 DIAGNOSIS — R1310 Dysphagia, unspecified: Secondary | ICD-10-CM | POA: Diagnosis not present

## 2023-05-19 DIAGNOSIS — J310 Chronic rhinitis: Secondary | ICD-10-CM | POA: Diagnosis not present

## 2023-05-19 DIAGNOSIS — M810 Age-related osteoporosis without current pathological fracture: Secondary | ICD-10-CM | POA: Diagnosis not present

## 2023-05-19 DIAGNOSIS — H9193 Unspecified hearing loss, bilateral: Secondary | ICD-10-CM | POA: Diagnosis not present

## 2023-05-19 DIAGNOSIS — Z8679 Personal history of other diseases of the circulatory system: Secondary | ICD-10-CM | POA: Diagnosis not present

## 2023-05-31 DIAGNOSIS — R7303 Prediabetes: Secondary | ICD-10-CM | POA: Diagnosis not present

## 2023-05-31 DIAGNOSIS — Z87898 Personal history of other specified conditions: Secondary | ICD-10-CM | POA: Diagnosis not present

## 2023-05-31 DIAGNOSIS — E785 Hyperlipidemia, unspecified: Secondary | ICD-10-CM | POA: Diagnosis not present

## 2023-06-18 DIAGNOSIS — J329 Chronic sinusitis, unspecified: Secondary | ICD-10-CM | POA: Diagnosis not present

## 2023-06-18 DIAGNOSIS — H1089 Other conjunctivitis: Secondary | ICD-10-CM | POA: Diagnosis not present

## 2023-06-27 DIAGNOSIS — Z124 Encounter for screening for malignant neoplasm of cervix: Secondary | ICD-10-CM | POA: Diagnosis not present

## 2023-06-27 DIAGNOSIS — Z01419 Encounter for gynecological examination (general) (routine) without abnormal findings: Secondary | ICD-10-CM | POA: Diagnosis not present

## 2023-06-27 DIAGNOSIS — Z01411 Encounter for gynecological examination (general) (routine) with abnormal findings: Secondary | ICD-10-CM | POA: Diagnosis not present

## 2023-06-29 DIAGNOSIS — H52203 Unspecified astigmatism, bilateral: Secondary | ICD-10-CM | POA: Diagnosis not present

## 2023-06-29 DIAGNOSIS — H2513 Age-related nuclear cataract, bilateral: Secondary | ICD-10-CM | POA: Diagnosis not present

## 2023-06-29 DIAGNOSIS — H43813 Vitreous degeneration, bilateral: Secondary | ICD-10-CM | POA: Diagnosis not present

## 2023-06-29 DIAGNOSIS — H524 Presbyopia: Secondary | ICD-10-CM | POA: Diagnosis not present

## 2023-06-29 DIAGNOSIS — H25013 Cortical age-related cataract, bilateral: Secondary | ICD-10-CM | POA: Diagnosis not present

## 2023-08-08 DIAGNOSIS — B351 Tinea unguium: Secondary | ICD-10-CM | POA: Diagnosis not present

## 2023-08-08 DIAGNOSIS — D2371 Other benign neoplasm of skin of right lower limb, including hip: Secondary | ICD-10-CM | POA: Diagnosis not present

## 2023-08-08 DIAGNOSIS — D1801 Hemangioma of skin and subcutaneous tissue: Secondary | ICD-10-CM | POA: Diagnosis not present

## 2023-08-08 DIAGNOSIS — L814 Other melanin hyperpigmentation: Secondary | ICD-10-CM | POA: Diagnosis not present

## 2023-08-08 DIAGNOSIS — D225 Melanocytic nevi of trunk: Secondary | ICD-10-CM | POA: Diagnosis not present

## 2023-08-08 DIAGNOSIS — D2261 Melanocytic nevi of right upper limb, including shoulder: Secondary | ICD-10-CM | POA: Diagnosis not present

## 2023-08-08 DIAGNOSIS — Z85828 Personal history of other malignant neoplasm of skin: Secondary | ICD-10-CM | POA: Diagnosis not present

## 2023-08-08 DIAGNOSIS — L821 Other seborrheic keratosis: Secondary | ICD-10-CM | POA: Diagnosis not present

## 2023-08-08 DIAGNOSIS — L820 Inflamed seborrheic keratosis: Secondary | ICD-10-CM | POA: Diagnosis not present

## 2023-08-23 ENCOUNTER — Ambulatory Visit (INDEPENDENT_AMBULATORY_CARE_PROVIDER_SITE_OTHER): Admitting: Otolaryngology

## 2023-09-13 ENCOUNTER — Other Ambulatory Visit (INDEPENDENT_AMBULATORY_CARE_PROVIDER_SITE_OTHER): Payer: Self-pay | Admitting: Otolaryngology

## 2023-09-13 ENCOUNTER — Ambulatory Visit (INDEPENDENT_AMBULATORY_CARE_PROVIDER_SITE_OTHER): Admitting: Audiology

## 2023-09-13 DIAGNOSIS — H903 Sensorineural hearing loss, bilateral: Secondary | ICD-10-CM | POA: Diagnosis not present

## 2023-09-13 NOTE — Progress Notes (Signed)
  [address] [place], [place] [address] [phone]  Audiological Evaluation    Name: Veronica Snyder     DOB:   [birth year]      MRN:   [id number]                                                                                     Service Date: 09/13/2023     Accompanied by: unaccompanied   Patient comes today after Dr. Karis, ENT sent a referral for a hearing evaluation due to concerns with hearing status.   Symptoms Yes Details  Hearing loss  [x]  Previous audiogram completed at Dr.Teoh's clinic in 2020 shows symmetric bilateral SNHL. Patient denies communication issues.   Tinnitus  []    Ear pain/ infections/pressure  []    Balance problems  []    Noise exposure history  []    Previous ear surgeries  []    Family history of hearing loss  [x]  yes  Amplification  []    Other  []      Otoscopy: Right ear: Clear external ear canal and notable landmarks visualized on the tympanic membrane. Left ear:  Clear external ear canal and notable landmarks visualized on the tympanic membrane.  Tympanometry: Right ear: Type A- Normal external ear canal volume with normal middle ear pressure and tympanic membrane compliance. Left ear: Type A- Normal external ear canal volume with normal middle ear pressure and tympanic membrane compliance.    Pure tone Audiometry: Right ear- Normal to moderate sensorineural hearing loss from 250 Hz - 8000 Hz. Left ear-  Normal to moderately severe sensorineural hearing loss from 250 Hz - 8000 Hz. No significant changes noted when compared to her previous audiogram.   Speech Audiometry: Right ear- Speech Reception Threshold (SRT) was obtained at 20 dBHL. Left ear-Speech Reception Threshold (SRT) was obtained at 20 dBHL.   Word Recognition Score Tested using NU-6 (recorded) Right ear: 100% was obtained at a presentation level of 70 dBHL with contralateral masking which is deemed as  excellent. Left ear: 100% was obtained at a presentation  level of 70 dBHL with contralateral masking which is deemed as  excellent.   The hearing test results were completed under headphones and results are deemed to be of good reliability. Test technique:  conventional    Recommendations: Follow up with ENT as scheduled for today. Return for a hearing evaluation in 1-2 years, before if concerns with hearing changes arise or per MD recommendation. Consider a communication needs assessment after medical clearance for hearing aids is obtained and when patient is ready.   Lukisha Procida MARIE LEROUX-MARTINEZ, AUD

## 2023-09-15 DIAGNOSIS — E785 Hyperlipidemia, unspecified: Secondary | ICD-10-CM | POA: Diagnosis not present

## 2023-09-19 ENCOUNTER — Encounter: Payer: Self-pay | Admitting: Audiology

## 2023-10-27 ENCOUNTER — Encounter (HOSPITAL_BASED_OUTPATIENT_CLINIC_OR_DEPARTMENT_OTHER): Payer: Self-pay | Admitting: Pulmonary Disease

## 2023-10-27 ENCOUNTER — Ambulatory Visit (HOSPITAL_BASED_OUTPATIENT_CLINIC_OR_DEPARTMENT_OTHER): Admitting: Pulmonary Disease

## 2023-10-27 VITALS — BP 117/66 | HR 74 | Ht 68.0 in | Wt 152.8 lb

## 2023-10-27 DIAGNOSIS — G4733 Obstructive sleep apnea (adult) (pediatric): Secondary | ICD-10-CM | POA: Diagnosis not present

## 2023-10-27 NOTE — Progress Notes (Signed)
 Subjective:    Patient ID: Veronica Snyder, female    DOB: 12-26-1945, 78 y.o.   MRN: [id number]  78 yo woman for follow-up of moderate OSA.  -on CPAP 8 cm     Discussed the use of AI scribe software for clinical note transcription with the patient, who gave verbal consent to proceed.  History of Present Illness  Discussed the use of AI scribe software for clinical note transcription with the patient, who gave verbal consent to proceed.  History of Present Illness   Veronica Snyder is a 78 year old female with sleep apnea who presents for a routine follow-up.  She uses a CPAP machine with auto settings of 7 to 10 cm H2O and finds it satisfactory. She attempted an oral appliance but discontinued due to discomfort. She prefers CPAP after a sleep study confirmed its effectiveness.  She purchased a travel CPAP but encountered moisture issues in the mask, which she plans to resolve before traveling.  She believes CPAP may alleviate her headaches, previously considered migraines or sinonasal headaches, and is avoiding surgery for her deviated septum.  She uses her CPAP diligently every night, occasionally removing it due to disturbances but always resuming use. She replaces her nasal mask frequently for cleanliness, while being cautious about replacing other equipment.       DL >> no residuals, excellent compliance, no leak    Significant tests/ events reviewed   04/2017 NPSG >> baseline -TST 121 minutes , AHI 18/h with RDI of 32/h>>corrected by CPAP of 7 cm with nasal pillows.  Most of the time was spent in 6 cm, few centrals   Review of Systems  neg for any significant sore throat, dysphagia, itching, sneezing, nasal congestion or excess/ purulent secretions, fever, chills, sweats, unintended wt loss, pleuritic or exertional cp, hempoptysis, orthopnea pnd or change in chronic leg swelling. Also denies presyncope, palpitations, heartburn, abdominal pain, nausea, vomiting,  diarrhea or change in bowel or urinary habits, dysuria,hematuria, rash, arthralgias, visual complaints, headache, numbness weakness or ataxia.      Objective:   Physical Exam  Gen. Pleasant, well-nourished, in no distress ENT - no thrush, no pallor/icterus,no post nasal drip Neck: No JVD, no thyromegaly, no carotid bruits Lungs: no use of accessory muscles, no dullness to percussion, clear without rales or rhonchi  Cardiovascular: Rhythm regular, heart sounds  normal, no murmurs or gallops, no peripheral edema Musculoskeletal: No deformities, no cyanosis or clubbing        Assessment & Plan:   Assessment and Plan Assessment & Plan   Assessment and Plan    Obstructive sleep apnea Obstructive sleep apnea is well-managed with current CPAP therapy. Previous high residual events have been reduced with adjustments to auto CPAP settings (7-10 cm H2O). She attempted use of an oral appliance but found it uncomfortable and less effective than CPAP. Current CPAP settings have reduced events to less than four per hour, which is considered excellent control. Reports some issues with a travel CPAP due to moisture in the mask, but prefers the standard CPAP. No need for surgical intervention like Inspire implant as CPAP is effective and well-tolerated. CPAP may also be beneficial in reducing sinonasal headaches, possibly due to the positive pressure alleviating sinus issues. - Continue current CPAP settings (7-10 cm H2O). - Advise her to manage travel CPAP moisture issues by adjusting humidity settings. - Encourage her to replace CPAP equipment based on wear and tear rather than fixed schedule. - Schedule annual follow-up to  monitor sleep apnea management.

## 2023-10-31 DIAGNOSIS — Z85828 Personal history of other malignant neoplasm of skin: Secondary | ICD-10-CM | POA: Diagnosis not present

## 2023-10-31 DIAGNOSIS — B078 Other viral warts: Secondary | ICD-10-CM | POA: Diagnosis not present

## 2023-11-22 DIAGNOSIS — J0111 Acute recurrent frontal sinusitis: Secondary | ICD-10-CM | POA: Diagnosis not present

## 2023-12-07 DIAGNOSIS — J3089 Other allergic rhinitis: Secondary | ICD-10-CM | POA: Diagnosis not present

## 2023-12-07 DIAGNOSIS — J300 Vasomotor rhinitis: Secondary | ICD-10-CM | POA: Diagnosis not present

## 2023-12-12 DIAGNOSIS — Z23 Encounter for immunization: Secondary | ICD-10-CM | POA: Diagnosis not present

## 2023-12-20 DIAGNOSIS — Z23 Encounter for immunization: Secondary | ICD-10-CM | POA: Diagnosis not present

## 2023-12-22 DIAGNOSIS — E785 Hyperlipidemia, unspecified: Secondary | ICD-10-CM | POA: Diagnosis not present

## 2023-12-22 DIAGNOSIS — R7303 Prediabetes: Secondary | ICD-10-CM | POA: Diagnosis not present

## 2023-12-22 DIAGNOSIS — G4733 Obstructive sleep apnea (adult) (pediatric): Secondary | ICD-10-CM | POA: Diagnosis not present

## 2023-12-28 DIAGNOSIS — J329 Chronic sinusitis, unspecified: Secondary | ICD-10-CM | POA: Diagnosis not present

## 2023-12-28 DIAGNOSIS — J310 Chronic rhinitis: Secondary | ICD-10-CM | POA: Diagnosis not present
